# Patient Record
Sex: Female | Born: 1990 | Race: White | Hispanic: No | Marital: Married | Smoking: Never smoker
Health system: Southern US, Community
[De-identification: ages and names within clinical notes are randomized; demographics above are authoritative.]

## PROBLEM LIST (undated history)

## (undated) ENCOUNTER — Inpatient Hospital Stay (HOSPITAL_COMMUNITY): Payer: Self-pay

## (undated) DIAGNOSIS — K8020 Calculus of gallbladder without cholecystitis without obstruction: Secondary | ICD-10-CM

## (undated) DIAGNOSIS — G43909 Migraine, unspecified, not intractable, without status migrainosus: Secondary | ICD-10-CM

## (undated) HISTORY — PX: TONSILLECTOMY: SUR1361

## (undated) HISTORY — PX: TYMPANOSTOMY TUBE PLACEMENT: SHX32

---
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
## 2011-03-18 LAB — DIFFERENTIAL
Basophils Absolute: 0 10*3/uL (ref 0.0–0.1)
Eosinophils Absolute: 0.1 10*3/uL (ref 0.0–1.2)
Eosinophils Relative: 2 % (ref 0–5)
Lymphs Abs: 1.9 10*3/uL (ref 1.1–4.8)

## 2011-03-18 LAB — COMPREHENSIVE METABOLIC PANEL
ALT: 19 U/L (ref 0–35)
AST: 20 U/L (ref 0–37)
CO2: 23 mEq/L (ref 19–32)
Chloride: 105 mEq/L (ref 96–112)
Sodium: 135 mEq/L (ref 135–145)
Total Bilirubin: 0.6 mg/dL (ref 0.3–1.2)

## 2011-03-18 LAB — CBC
RBC: 4.26 MIL/uL (ref 3.80–5.70)
WBC: 5.6 10*3/uL (ref 4.5–13.5)

## 2011-03-18 LAB — URINE CULTURE

## 2011-03-18 LAB — URINALYSIS, ROUTINE W REFLEX MICROSCOPIC
Hgb urine dipstick: NEGATIVE
Nitrite: NEGATIVE
Specific Gravity, Urine: 1.009 (ref 1.005–1.030)
Urobilinogen, UA: 0.2 mg/dL (ref 0.0–1.0)

## 2011-03-18 LAB — LIPASE, BLOOD: Lipase: 14 U/L (ref 11–59)

## 2011-03-18 LAB — URINE MICROSCOPIC-ADD ON

## 2011-04-08 ENCOUNTER — Other Ambulatory Visit (HOSPITAL_COMMUNITY): Payer: Self-pay | Admitting: Family Medicine

## 2011-04-08 DIAGNOSIS — N946 Dysmenorrhea, unspecified: Secondary | ICD-10-CM

## 2011-04-08 DIAGNOSIS — R102 Pelvic and perineal pain: Secondary | ICD-10-CM

## 2011-04-10 ENCOUNTER — Other Ambulatory Visit (HOSPITAL_COMMUNITY): Payer: Self-pay | Admitting: Family Medicine

## 2011-04-10 ENCOUNTER — Ambulatory Visit (HOSPITAL_COMMUNITY)
Admission: RE | Admit: 2011-04-10 | Discharge: 2011-04-10 | Disposition: A | Discharge status: Home / Self Care | Payer: BC Managed Care – PPO | Source: Ambulatory Visit | Attending: Family Medicine | Admitting: Family Medicine

## 2011-04-10 DIAGNOSIS — N949 Unspecified condition associated with female genital organs and menstrual cycle: Secondary | ICD-10-CM | POA: Insufficient documentation

## 2011-04-10 DIAGNOSIS — R102 Pelvic and perineal pain: Secondary | ICD-10-CM

## 2011-04-10 DIAGNOSIS — N946 Dysmenorrhea, unspecified: Secondary | ICD-10-CM

## 2013-10-10 ENCOUNTER — Other Ambulatory Visit (HOSPITAL_COMMUNITY): Payer: Self-pay | Admitting: Adult Health Nurse Practitioner

## 2013-10-10 ENCOUNTER — Ambulatory Visit (HOSPITAL_COMMUNITY)
Admission: RE | Admit: 2013-10-10 | Discharge: 2013-10-10 | Disposition: A | Discharge status: Home / Self Care | Payer: PRIVATE HEALTH INSURANCE | Source: Ambulatory Visit | Attending: Adult Health Nurse Practitioner | Admitting: Adult Health Nurse Practitioner

## 2013-10-10 DIAGNOSIS — G039 Meningitis, unspecified: Secondary | ICD-10-CM

## 2013-10-10 DIAGNOSIS — R112 Nausea with vomiting, unspecified: Secondary | ICD-10-CM | POA: Insufficient documentation

## 2013-10-10 DIAGNOSIS — R509 Fever, unspecified: Secondary | ICD-10-CM | POA: Insufficient documentation

## 2013-10-10 DIAGNOSIS — R51 Headache: Secondary | ICD-10-CM | POA: Insufficient documentation

## 2013-10-17 ENCOUNTER — Emergency Department (HOSPITAL_COMMUNITY)
Admission: EM | Admit: 2013-10-17 | Discharge: 2013-10-17 | Disposition: A | Discharge status: Home / Self Care | Payer: PRIVATE HEALTH INSURANCE | Attending: Emergency Medicine | Admitting: Emergency Medicine

## 2013-10-17 ENCOUNTER — Emergency Department (HOSPITAL_COMMUNITY): Payer: PRIVATE HEALTH INSURANCE

## 2013-10-17 ENCOUNTER — Encounter (HOSPITAL_COMMUNITY): Payer: Self-pay | Admitting: Emergency Medicine

## 2013-10-17 DIAGNOSIS — R55 Syncope and collapse: Secondary | ICD-10-CM | POA: Insufficient documentation

## 2013-10-17 DIAGNOSIS — R51 Headache: Secondary | ICD-10-CM | POA: Insufficient documentation

## 2013-10-17 DIAGNOSIS — R519 Headache, unspecified: Secondary | ICD-10-CM

## 2013-10-17 DIAGNOSIS — R42 Dizziness and giddiness: Secondary | ICD-10-CM | POA: Insufficient documentation

## 2013-10-17 DIAGNOSIS — R209 Unspecified disturbances of skin sensation: Secondary | ICD-10-CM | POA: Insufficient documentation

## 2013-10-17 DIAGNOSIS — Z79899 Other long term (current) drug therapy: Secondary | ICD-10-CM | POA: Insufficient documentation

## 2013-10-17 DIAGNOSIS — Z3202 Encounter for pregnancy test, result negative: Secondary | ICD-10-CM | POA: Insufficient documentation

## 2013-10-17 DIAGNOSIS — R110 Nausea: Secondary | ICD-10-CM | POA: Insufficient documentation

## 2013-10-17 LAB — CSF CELL COUNT WITH DIFFERENTIAL
RBC Count, CSF: 5 /mm3 — ABNORMAL HIGH
WBC, CSF: 0 /mm3 (ref 0–5)
WBC, CSF: 0 /mm3 (ref 0–5)

## 2013-10-17 LAB — PREGNANCY, URINE: Preg Test, Ur: NEGATIVE

## 2013-10-17 LAB — PROTEIN AND GLUCOSE, CSF
Glucose, CSF: 59 mg/dL (ref 43–76)
Total  Protein, CSF: 30 mg/dL (ref 15–45)

## 2013-10-17 LAB — URINE MICROSCOPIC-ADD ON

## 2013-10-17 LAB — URINALYSIS, ROUTINE W REFLEX MICROSCOPIC
Bilirubin Urine: NEGATIVE
Ketones, ur: NEGATIVE mg/dL
Nitrite: NEGATIVE
Protein, ur: NEGATIVE mg/dL
Urobilinogen, UA: 0.2 mg/dL (ref 0.0–1.0)

## 2013-10-17 LAB — CBC WITH DIFFERENTIAL/PLATELET
Basophils Absolute: 0 10*3/uL (ref 0.0–0.1)
Basophils Relative: 0 % (ref 0–1)
Eosinophils Absolute: 0 10*3/uL (ref 0.0–0.7)
Eosinophils Relative: 0 % (ref 0–5)
HCT: 40.9 % (ref 36.0–46.0)
MCH: 30.2 pg (ref 26.0–34.0)
MCHC: 34 g/dL (ref 30.0–36.0)
Monocytes Absolute: 0.5 10*3/uL (ref 0.1–1.0)
Neutro Abs: 7.7 10*3/uL (ref 1.7–7.7)
RDW: 12.7 % (ref 11.5–15.5)

## 2013-10-17 LAB — BASIC METABOLIC PANEL
Calcium: 9.6 mg/dL (ref 8.4–10.5)
Chloride: 100 mEq/L (ref 96–112)
Creatinine, Ser: 0.66 mg/dL (ref 0.50–1.10)
GFR calc Af Amer: >90 mL/min (ref >90)
GFR calc non Af Amer: >90 mL/min (ref >90)

## 2013-10-17 MED ORDER — METOCLOPRAMIDE HCL 5 MG/ML IJ SOLN
10.0000 mg | Freq: Once | INTRAMUSCULAR | Status: AC
  Administered 2013-10-17: 10 mg via INTRAVENOUS
  Filled 2013-10-17: qty 2

## 2013-10-17 MED ORDER — SODIUM CHLORIDE 0.9 % IV BOLUS (SEPSIS)
1000.0000 mL | Freq: Once | INTRAVENOUS | Status: AC
  Administered 2013-10-17: 1000 mL via INTRAVENOUS

## 2013-10-17 MED ORDER — KETOROLAC TROMETHAMINE 30 MG/ML IJ SOLN
30.0000 mg | Freq: Once | INTRAMUSCULAR | Status: AC
  Administered 2013-10-17: 30 mg via INTRAVENOUS
  Filled 2013-10-17: qty 1

## 2013-10-17 MED ORDER — LIDOCAINE HCL (PF) 1 % IJ SOLN
INTRAMUSCULAR | Status: AC
  Filled 2013-10-17: qty 5

## 2013-10-17 MED ORDER — DIPHENHYDRAMINE HCL 50 MG/ML IJ SOLN
25.0000 mg | Freq: Once | INTRAMUSCULAR | Status: AC
  Administered 2013-10-17: 25 mg via INTRAVENOUS
  Filled 2013-10-17: qty 1

## 2013-10-17 MED ORDER — ONDANSETRON HCL 4 MG/2ML IJ SOLN
4.0000 mg | Freq: Once | INTRAMUSCULAR | Status: AC
  Administered 2013-10-17: 4 mg via INTRAVENOUS
  Filled 2013-10-17 (×2): qty 2

## 2013-10-17 NOTE — ED Notes (Signed)
Transported to radiology for LP.

## 2013-10-17 NOTE — ED Notes (Signed)
Pt states she had a headache when she went to school this morning. States she passed out twice

## 2013-10-17 NOTE — ED Provider Notes (Signed)
Patient signed out to me to followup on lumbar puncture studies. Patient was originally seen for headache and syncope. CT head was unremarkable. Patient was somewhat hypotensive. ER it was felt that she likely had hypotension or vasovagal episode causing her syncope, but with the headache, lumbar puncture was pursued to rule out subarachnoid hemorrhage. Studies do not suggest any evidence of subarachnoid hemorrhage. Similarly, studies were negative for infection.  Patient was treated for migraine headache with Toradol, Reglan and nitroglycerin. Patient has improved significantly. She has been able to urinate you are without any symptoms. She'll be discharged, is accompanied by her parents.  Gilda Crease, MD 10/17/13 [phone]

## 2013-10-17 NOTE — Procedures (Signed)
Preprocedure Dx: Headache, syncope Postprocedure Dx: Headache, syncope Procedure:  Fluoroscopically guided lumbar puncture Radiologist:  Tyron Russell Anesthesia:  3 ml of 1% lidocaine Specimen:  9 ml CSF, clear colorless EBL:   None Opening pressure: 6 cm H2O Complications: None

## 2013-10-17 NOTE — ED Notes (Signed)
Pt has eaten, up to bathroom and ambulated and reports is feeling much better.  edp notified.

## 2013-10-17 NOTE — ED Notes (Signed)
Pt reports left hand tingling and both sides of face tingling.  States this happened right before she "passed out."  Pt also states both hands and both feet were "rigid" prior and after "passing out."  Denies rigidity at this time.

## 2013-10-17 NOTE — ED Notes (Signed)
Pt returned from from Radiology from LP.  Alert and oriented, laying flat.  Vitals wnl.  No complaints.  nad noted.

## 2013-10-17 NOTE — ED Provider Notes (Signed)
CSN: [id number]     Arrival date & time 10/17/13  1249 History   This chart was scribed for Glynn Octave, MD, by Yevette Edwards, ED Scribe. This patient was seen in room [facility] and the patient's care was started at 1:39 PM.  First MD Initiated Contact with Patient 10/17/13 1323     Chief Complaint  Patient presents with  . Loss of Consciousness   The history is provided by the patient. No language interpreter was used.   HPI Comments: Tara Watson is a 22 y.o. female who presents to the Emergency Department complaining of three episodes of syncope which occurred in class today. Prior to the syncopal episodes, she experienced a headache which worsened, lightheadedness, nausea, and generalized parathesia. The headache prior to the syncopal episodes was the worst headache she has experienced in the previous two weeks. Today's syncopal episodes lasted only a few seconds; she denies any associated head impact. She reports that she had not eaten prior to the syncopes. The pt denies any h/o similar syncopal episodes.The pt has had intermittent daily headaches for two weeks; her headaches are located to the frontal region and their onset vacillates between sudden and gradual. She takes tramadol for her headaches. She denies any chest pain, SOB, or neck pain. She denies any possibility of pregnancy.  The pt states she is allergic to erythromycin, codeine, ceftin, and sulfa-drugs. She is a non-smoker.   History reviewed. No pertinent past medical history. History reviewed. No pertinent past surgical history. No family history on file. History  Substance Use Topics  . Smoking status: Never Smoker   . Smokeless tobacco: Not on file  . Alcohol Use: No   No OB history provided.  Review of Systems  Respiratory: Negative for shortness of breath.   Cardiovascular: Negative for chest pain.  Gastrointestinal: Positive for nausea.  Musculoskeletal: Negative for neck pain.  Neurological: Positive  for syncope, light-headedness, numbness and headaches.  All other systems reviewed and are negative.    Allergies  Ceftin; Codeine; Erythromycin; and Sulfa antibiotics  Home Medications   Current Outpatient Rx  Name  Route  Sig  Dispense  Refill  . Norethin-Eth Estrad-Fe Biphas (LO LOESTRIN FE PO)   Oral   Take 1 tablet by mouth daily.         . ondansetron (ZOFRAN) 4 MG tablet   Oral   Take 4 mg by mouth daily as needed for nausea or vomiting.         . traMADol (ULTRAM) 50 MG tablet   Oral   Take 50 mg by mouth 2 (two) times daily as needed (headache).           Triage Vitals: BP 118/72  Pulse 108  Temp(Src) 98.8 F (37.1 C) (Oral)  Resp 17  Ht 5' (1.524 m)  Wt 140 lb (63.504 kg)  BMI 27.34 kg/m2  SpO2 98%  LMP 09/11/2013  Physical Exam  Nursing note and vitals reviewed. Constitutional: She is oriented to person, place, and time. She appears well-developed and well-nourished. No distress.  HENT:  Head: Normocephalic and atraumatic.  Dry mucous membranes.   Eyes: EOM are normal.  No nystagmus.   Neck: Neck supple. No tracheal deviation present.  Cardiovascular: Normal rate, regular rhythm and normal heart sounds.   No murmur heard. Pulmonary/Chest: Effort normal and breath sounds normal. No respiratory distress. She has no wheezes.  Musculoskeletal: Normal range of motion.  Neurological: She is alert and oriented to person, place, and  time.  CN 2-12 intact, no ataxia on finger to nose, no nystagmus, 5/5 strength throughout, no pronator drift, Romberg negative.   Skin: Skin is warm and dry.  Psychiatric: She has a normal mood and affect. Her behavior is normal.    ED Course  Procedures (including critical care time)  DIAGNOSTIC STUDIES: Oxygen Saturation is 98% on room air, normal by my interpretation.    COORDINATION OF CARE:  1:46 PM- Discussed treatment plan with patient, and the patient agreed to the plan.   Labs Review Labs Reviewed   URINALYSIS, ROUTINE W REFLEX MICROSCOPIC - Abnormal; Notable for the following:    Hgb urine dipstick TRACE (*)    All other components within normal limits  CSF CULTURE  CBC WITH DIFFERENTIAL  BASIC METABOLIC PANEL  PREGNANCY, URINE  URINE MICROSCOPIC-ADD ON  CSF CELL COUNT WITH DIFFERENTIAL  CSF CELL COUNT WITH DIFFERENTIAL  PROTEIN AND GLUCOSE, CSF   Imaging Review Ct Head Wo Contrast  10/17/2013   CLINICAL DATA:  Two week history of headache.  History of fainting.  EXAM: CT HEAD WITHOUT CONTRAST  TECHNIQUE: Contiguous axial images were obtained from the base of the skull through the vertex without intravenous contrast.  COMPARISON:  10/10/2013.  FINDINGS: There is no evidence of brain mass, brain hemorrhage, or acute infarction.  The ventricular system is normal size and shape. There is no evidence of shift of midline structures, parenchymal lesion, or subdural or epidural hematoma.  The calvarium is intact. Mastoids are well aerated. No sinusitis is evident.  IMPRESSION: There is no evidence of brain mass, brain hemorrhage, or acute infarction.  No acute or active process is seen. No skull lesion is evident. No sinusitis is evident.   Electronically Signed   By: Onalee Hua  Call M.D.   On: 10/17/2013 15:13   Dg Lumbar Puncture Fluoro Guide  10/17/2013   CLINICAL DATA:  Syncope, headache, recent fever  EXAM: DIAGNOSTIC LUMBAR PUNCTURE UNDER FLUOROSCOPIC GUIDANCE  FLUOROSCOPY TIME:  0 min 12 seconds  PROCEDURE: Informed consent was obtained from the patient prior to the procedure, including potential complications of headache, allergy, and pain. With the patient prone, the lower back was prepped with Betadine. 1% Lidocaine was used for local anesthesia. Lumbar puncture was performed at the L4-L5 level using a gauge needle with return of clear colorless CSF with an opening pressure of 6 cm water (prone). 9 ml of CSF were obtained for laboratory studies. The patient tolerated the procedure well and  there were no apparent complications.  IMPRESSION: Fluoroscopic guided lumbar puncture as above.   Electronically Signed   By: Ulyses Southward M.D.   On: 10/17/2013 16:42    EKG Interpretation   None       MDM  No diagnosis found. History of headaches for the past 2 weeks. Lightheadedness preceding syncopal episode this morning. Patient states headache suddenly get worse and she began to feel nauseated lightheaded and passed out. She said it happened 3 times. No history of tongue biting, seizure activity or incontinence. No chest pain or SOB   normal neurological exam now. CT head negative. EKG normal sinus rhythm. Pregnancy test negative. Orthostatics are positive in heart rate. Etiology of syncope likely vasovagal the patient was sitting at the time. However the sudden worsening of her headache is worrisome for subarachnoid hemorrhage.   Discussed LP with patient who agrees.  Results pending at time of sign out to Dr. Blinda Leatherwood.   Date 10/17/2013  Rate: 98  Rhythm:  normal sinus rhythm  QRS Axis: normal  Intervals: normal  ST/T Wave abnormalities: nonspecific ST/T changes  Conduction Disutrbances:none  Narrative Interpretation:   Old EKG Reviewed: none available  BP 89/55  Pulse 83  Temp(Src) 98.8 F (37.1 C) (Oral)  Resp 18  Ht 5' (1.524 m)  Wt 140 lb (63.504 kg)  BMI 27.34 kg/m2  SpO2 97%  LMP 09/11/2013   I personally performed the services described in this documentation, which was scribed in my presence. The recorded information has been reviewed and is accurate.    Glynn Octave, MD 10/17/13 [phone]

## 2013-10-21 LAB — CSF CULTURE

## 2013-10-21 LAB — CSF CULTURE W GRAM STAIN

## 2013-10-24 ENCOUNTER — Other Ambulatory Visit (HOSPITAL_COMMUNITY): Payer: Self-pay | Admitting: Adult Health Nurse Practitioner

## 2013-10-24 DIAGNOSIS — R55 Syncope and collapse: Secondary | ICD-10-CM

## 2013-10-26 ENCOUNTER — Other Ambulatory Visit (HOSPITAL_COMMUNITY): Payer: Self-pay | Admitting: Adult Health Nurse Practitioner

## 2013-10-26 DIAGNOSIS — R0989 Other specified symptoms and signs involving the circulatory and respiratory systems: Secondary | ICD-10-CM

## 2013-10-27 ENCOUNTER — Other Ambulatory Visit (HOSPITAL_COMMUNITY): Payer: PRIVATE HEALTH INSURANCE

## 2013-10-27 ENCOUNTER — Ambulatory Visit (HOSPITAL_COMMUNITY)
Admission: RE | Admit: 2013-10-27 | Discharge: 2013-10-27 | Disposition: A | Discharge status: Home / Self Care | Payer: PRIVATE HEALTH INSURANCE | Source: Ambulatory Visit | Attending: Adult Health Nurse Practitioner | Admitting: Adult Health Nurse Practitioner

## 2013-10-27 ENCOUNTER — Ambulatory Visit (HOSPITAL_COMMUNITY): Payer: PRIVATE HEALTH INSURANCE

## 2013-10-27 DIAGNOSIS — R51 Headache: Secondary | ICD-10-CM | POA: Insufficient documentation

## 2013-10-27 DIAGNOSIS — R55 Syncope and collapse: Secondary | ICD-10-CM | POA: Insufficient documentation

## 2013-10-27 DIAGNOSIS — Z9889 Other specified postprocedural states: Secondary | ICD-10-CM | POA: Insufficient documentation

## 2013-10-27 DIAGNOSIS — R112 Nausea with vomiting, unspecified: Secondary | ICD-10-CM | POA: Insufficient documentation

## 2013-10-27 MED ORDER — GADOBENATE DIMEGLUMINE 529 MG/ML IV SOLN
13.0000 mL | Freq: Once | INTRAVENOUS | Status: AC | PRN
  Administered 2013-10-27: 13 mL via INTRAVENOUS

## 2013-10-30 ENCOUNTER — Other Ambulatory Visit (HOSPITAL_COMMUNITY): Payer: PRIVATE HEALTH INSURANCE

## 2013-10-31 ENCOUNTER — Ambulatory Visit (HOSPITAL_COMMUNITY)
Admission: RE | Admit: 2013-10-31 | Discharge: 2013-10-31 | Disposition: A | Discharge status: Home / Self Care | Payer: PRIVATE HEALTH INSURANCE | Source: Ambulatory Visit | Attending: Adult Health Nurse Practitioner | Admitting: Adult Health Nurse Practitioner

## 2013-10-31 DIAGNOSIS — I499 Cardiac arrhythmia, unspecified: Secondary | ICD-10-CM | POA: Insufficient documentation

## 2013-10-31 DIAGNOSIS — R0989 Other specified symptoms and signs involving the circulatory and respiratory systems: Secondary | ICD-10-CM | POA: Insufficient documentation

## 2013-10-31 DIAGNOSIS — I369 Nonrheumatic tricuspid valve disorder, unspecified: Secondary | ICD-10-CM

## 2013-10-31 DIAGNOSIS — R55 Syncope and collapse: Secondary | ICD-10-CM | POA: Insufficient documentation

## 2013-10-31 NOTE — Progress Notes (Signed)
*  PRELIMINARY RESULTS* Echocardiogram 2D Echocardiogram has been performed.  Tara Watson 10/31/2013, 3:32 PM

## 2013-11-13 ENCOUNTER — Ambulatory Visit (INDEPENDENT_AMBULATORY_CARE_PROVIDER_SITE_OTHER): Payer: PRIVATE HEALTH INSURANCE | Admitting: Cardiology

## 2013-11-13 ENCOUNTER — Encounter: Payer: Self-pay | Admitting: Cardiology

## 2013-11-13 VITALS — BP 118/87 | HR 83 | Ht 60.0 in | Wt 153.5 lb

## 2013-11-13 DIAGNOSIS — R519 Headache, unspecified: Secondary | ICD-10-CM | POA: Insufficient documentation

## 2013-11-13 DIAGNOSIS — R51 Headache: Secondary | ICD-10-CM

## 2013-11-13 DIAGNOSIS — R55 Syncope and collapse: Secondary | ICD-10-CM

## 2013-11-13 DIAGNOSIS — R0989 Other specified symptoms and signs involving the circulatory and respiratory systems: Secondary | ICD-10-CM | POA: Insufficient documentation

## 2013-11-13 NOTE — Assessment & Plan Note (Signed)
I suggested that they might want to pursue neurological consultation with recurring headaches on chronic intermittent history over time. Could potentially be a vascular headache that may need further treatment.

## 2013-11-13 NOTE — Patient Instructions (Addendum)
Your physician recommends that you schedule a follow-up appointment in: AS NEEDED  

## 2013-11-13 NOTE — Progress Notes (Signed)
Clinical Summary [name] is a 22 y.o.female referred for cardiology consultation by [name] NP for evaluation of a carotid bruit. Record review finds recent evaluation in November with headaches and probable neurocardiogenic syncope. Patient also underwent a lumbar puncture in the ER. On examination she was noted to have a right carotid bruit and was referred for further workup.  Recent brain MRI/MRA showed normal appearance of the brain, normal variant MRA circle of Willis, moderate tortuosity of the cervical right ICA without stenosis. Followup carotid Dopplers showed no evidence of hemodynamically significant ICA stenoses.  Echocardiogram in November showed normal LV wall thickness and chamber size with LVEF 60-65%, normal diastolic parameters, mild tricuspid regurgitation, normal PASP of 15 mm mercury, no pericardial effusion. ECG in November shows sinus rhythm with nonspecific ST changes.  She is here with her father and mother today. We discussed the results of her testing in detail. It is most likely that the tortuosity of the cervical right ICA explains the soft bruit on examination, although otherwise has no clinical significance, shows no evidence of obstruction, and is not an explanation for any of her recent symptomatology.   From a symptom perspective, she reports a long-standing history of recurrent headaches, reportedly saw a neurologist when she was a child. The events of syncope that occurred in November sound vasovagal in description per review today. She works as a Lawyer, also going to nursing school. Mother states that she has been under stress.   Allergies  Allergen Reactions  . Ceftin [Cefuroxime] Hives  . Codeine Hives  . Erythromycin Hives  . Sulfa Antibiotics Other (See Comments)    Gland swelling     Current Outpatient Prescriptions  Medication Sig Dispense Refill  . levonorgestrel-ethinyl estradiol (SEASONALE,INTROVALE,JOLESSA) 0.15-0.03 MG tablet Take 1  tablet by mouth daily.      . ondansetron (ZOFRAN) 4 MG tablet Take 4 mg by mouth daily as needed for nausea or vomiting.      . traMADol (ULTRAM) 50 MG tablet Take 50 mg by mouth 2 (two) times daily as needed (headache).       No current facility-administered medications for this visit.    Past Medical History  Diagnosis Date  . Generalized headaches     History reviewed. No pertinent past surgical history.  History reviewed. No pertinent family history.  Social History Tara Watson reports that she has never smoked. She does not have any smokeless tobacco history on file. Tara Watson reports that she does not drink alcohol.  Review of Systems No palpitations, no sudden syncope without warning. No exertional chest pain. Uses contacts for vision. Otherwise negative.  Physical Examination Filed Vitals:   11/13/13 1456  BP: 118/87  Pulse: 83   Filed Weights   11/13/13 1456  Weight: 153 lb 8 oz (69.627 kg)   Normally nourished appearing young woman, appears comfortable. HEENT: Conjunctiva and lids normal, oropharynx clear. Neck: Supple, no elevated JVP, faint high right carotid bruit, no thyromegaly. Lungs: Clear to auscultation, nonlabored breathing at rest. Cardiac: Regular rate and rhythm, no S3 or significant systolic murmur, no pericardial rub. Extremities: No pitting edema, distal pulses 2+. Skin: Warm and dry. Musculoskeletal: No kyphosis. Neuropsychiatric: Alert and oriented x3, affect grossly appropriate.   Problem List and Plan   Carotid bruit Likely secondary to moderately tortuous cervical right ICA with no evidence of hemodynamic significance by Dopplers. This is unlikely to be of any clinical significance at this point, and would not be an explanation  for any of her recent symptoms.  Syncope Description consistent with neurocardiogenic syncope. We discussed triggers, maintaining adequate hydration. Baseline ECG is normal.  Frequent headaches I suggested that  they might want to pursue neurological consultation with recurring headaches on chronic intermittent history over time. Could potentially be a vascular headache that may need further treatment.    Jonelle Sidle, M.D., F.A.C.C.

## 2013-11-13 NOTE — Assessment & Plan Note (Signed)
Likely secondary to moderately tortuous cervical right ICA with no evidence of hemodynamic significance by Dopplers. This is unlikely to be of any clinical significance at this point, and would not be an explanation for any of her recent symptoms.

## 2013-11-13 NOTE — Assessment & Plan Note (Signed)
Description consistent with neurocardiogenic syncope. We discussed triggers, maintaining adequate hydration. Baseline ECG is normal.

## 2014-10-25 LAB — OB RESULTS CONSOLE ABO/RH: RH Type: POSITIVE

## 2014-10-25 LAB — OB RESULTS CONSOLE RPR: RPR: NONREACTIVE

## 2014-10-25 LAB — OB RESULTS CONSOLE ANTIBODY SCREEN: Antibody Screen: NEGATIVE

## 2014-10-25 LAB — OB RESULTS CONSOLE RUBELLA ANTIBODY, IGM: Rubella: IMMUNE

## 2014-10-25 LAB — OB RESULTS CONSOLE HIV ANTIBODY (ROUTINE TESTING): HIV: NONREACTIVE

## 2014-10-25 LAB — OB RESULTS CONSOLE HEPATITIS B SURFACE ANTIGEN: HEP B S AG: NEGATIVE

## 2014-12-14 ENCOUNTER — Inpatient Hospital Stay (HOSPITAL_COMMUNITY)
Admission: AD | Admit: 2014-12-14 | Discharge: 2014-12-15 | Disposition: A | Discharge status: Home / Self Care | Payer: Medicaid Other | Source: Ambulatory Visit | Attending: Obstetrics and Gynecology | Admitting: Obstetrics and Gynecology

## 2014-12-14 ENCOUNTER — Encounter (HOSPITAL_COMMUNITY): Payer: Self-pay

## 2014-12-14 DIAGNOSIS — R1013 Epigastric pain: Secondary | ICD-10-CM | POA: Diagnosis not present

## 2014-12-14 DIAGNOSIS — O209 Hemorrhage in early pregnancy, unspecified: Secondary | ICD-10-CM | POA: Diagnosis not present

## 2014-12-14 DIAGNOSIS — R1011 Right upper quadrant pain: Secondary | ICD-10-CM | POA: Diagnosis not present

## 2014-12-14 DIAGNOSIS — O4692 Antepartum hemorrhage, unspecified, second trimester: Secondary | ICD-10-CM | POA: Diagnosis not present

## 2014-12-14 DIAGNOSIS — Z3A16 16 weeks gestation of pregnancy: Secondary | ICD-10-CM | POA: Diagnosis not present

## 2014-12-14 DIAGNOSIS — O4690 Antepartum hemorrhage, unspecified, unspecified trimester: Secondary | ICD-10-CM | POA: Insufficient documentation

## 2014-12-14 DIAGNOSIS — O9989 Other specified diseases and conditions complicating pregnancy, childbirth and the puerperium: Secondary | ICD-10-CM | POA: Diagnosis not present

## 2014-12-14 NOTE — MAU Note (Signed)
Pt reports she had a sharp pain in her midupper Right quadrant of her abd. A few hours ago felt like she was going to vomit and did. She then went to the BR and had a gush of blood in the toilet. No bleeding since but still feeling the abd pain.

## 2014-12-15 ENCOUNTER — Encounter (HOSPITAL_COMMUNITY): Payer: Self-pay | Admitting: *Deleted

## 2014-12-15 ENCOUNTER — Inpatient Hospital Stay (HOSPITAL_COMMUNITY): Payer: Medicaid Other

## 2014-12-15 DIAGNOSIS — O4692 Antepartum hemorrhage, unspecified, second trimester: Secondary | ICD-10-CM | POA: Diagnosis not present

## 2014-12-15 DIAGNOSIS — Z3A16 16 weeks gestation of pregnancy: Secondary | ICD-10-CM | POA: Insufficient documentation

## 2014-12-15 DIAGNOSIS — O9989 Other specified diseases and conditions complicating pregnancy, childbirth and the puerperium: Secondary | ICD-10-CM

## 2014-12-15 DIAGNOSIS — R1011 Right upper quadrant pain: Secondary | ICD-10-CM

## 2014-12-15 DIAGNOSIS — O4690 Antepartum hemorrhage, unspecified, unspecified trimester: Secondary | ICD-10-CM | POA: Insufficient documentation

## 2014-12-15 LAB — COMPREHENSIVE METABOLIC PANEL
ALBUMIN: 3.4 g/dL — AB (ref 3.5–5.2)
ALK PHOS: 91 U/L (ref 39–117)
ALT: 16 U/L (ref 0–35)
AST: 16 U/L (ref 0–37)
Anion gap: 5 (ref 5–15)
BUN: 6 mg/dL (ref 6–23)
CHLORIDE: 106 meq/L (ref 96–112)
CO2: 27 mmol/L (ref 19–32)
Calcium: 8.9 mg/dL (ref 8.4–10.5)
Creatinine, Ser: 0.38 mg/dL — ABNORMAL LOW (ref 0.50–1.10)
GFR calc Af Amer: >90 mL/min (ref >90)
Glucose, Bld: 89 mg/dL (ref 70–99)
Potassium: 3.8 mmol/L (ref 3.5–5.1)
SODIUM: 138 mmol/L (ref 135–145)
TOTAL PROTEIN: 6.8 g/dL (ref 6.0–8.3)
Total Bilirubin: 0.3 mg/dL (ref 0.3–1.2)

## 2014-12-15 LAB — URINALYSIS, ROUTINE W REFLEX MICROSCOPIC
BILIRUBIN URINE: NEGATIVE
GLUCOSE, UA: NEGATIVE mg/dL
Hgb urine dipstick: NEGATIVE
Ketones, ur: NEGATIVE mg/dL
Leukocytes, UA: NEGATIVE
Nitrite: NEGATIVE
Protein, ur: NEGATIVE mg/dL
Specific Gravity, Urine: 1.02 (ref 1.005–1.030)
Urobilinogen, UA: 0.2 mg/dL (ref 0.0–1.0)
pH: 6.5 (ref 5.0–8.0)

## 2014-12-15 LAB — CBC
HCT: 35.9 % — ABNORMAL LOW (ref 36.0–46.0)
Hemoglobin: 12.4 g/dL (ref 12.0–15.0)
MCH: 30.8 pg (ref 26.0–34.0)
MCHC: 34.5 g/dL (ref 30.0–36.0)
MCV: 89.1 fL (ref 78.0–100.0)
Platelets: 198 K/uL (ref 150–400)
RBC: 4.03 MIL/uL (ref 3.87–5.11)
RDW: 12.7 % (ref 11.5–15.5)
WBC: 8.3 K/uL (ref 4.0–10.5)

## 2014-12-15 LAB — AMYLASE: AMYLASE: 58 U/L (ref 0–105)

## 2014-12-15 LAB — ABO/RH: ABO/RH(D): O POS

## 2014-12-15 LAB — WET PREP, GENITAL
Clue Cells Wet Prep HPF POC: NONE SEEN
Trich, Wet Prep: NONE SEEN
Yeast Wet Prep HPF POC: NONE SEEN

## 2014-12-15 LAB — LIPASE, BLOOD: Lipase: 24 U/L (ref 11–59)

## 2014-12-15 NOTE — Discharge Instructions (Signed)
Vaginal Bleeding During Pregnancy, Second Trimester °A small amount of bleeding (spotting) from the vagina is relatively common in pregnancy. It usually stops on its own. Various things can cause bleeding or spotting in pregnancy. Some bleeding may be related to the pregnancy, and some may not. Sometimes the bleeding is normal and is not a problem. However, bleeding can also be a sign of something serious. Be sure to tell your health care provider about any vaginal bleeding right away. °Some possible causes of vaginal bleeding during the second trimester include: °· Infection, inflammation, or growths on the cervix.   °· The placenta may be partially or completely covering the opening of the cervix inside the uterus (placenta previa). °· The placenta may have separated from the uterus (abruption of the placenta).   °· You may be having early (preterm) labor.   °· The cervix may not be strong enough to keep a baby inside the uterus (cervical insufficiency).   °· Tiny cysts may have developed in the uterus instead of pregnancy tissue (molar pregnancy).  °HOME CARE INSTRUCTIONS  °Watch your condition for any changes. The following actions may help to lessen any discomfort you are feeling: °· Follow your health care provider's instructions for limiting your activity. If your health care provider orders bed rest, you may need to stay in bed and only get up to use the bathroom. However, your health care provider may allow you to continue light activity. °· If needed, make plans for someone to help with your regular activities and responsibilities while you are on bed rest. °· Keep track of the number of pads you use each day, how often you change pads, and how soaked (saturated) they are. Write this down. °· Do not use tampons. Do not douche. °· Do not have sexual intercourse or orgasms until approved by your health care provider. °· If you pass any tissue from your vagina, save the tissue so you can show it to your  health care provider. °· Only take over-the-counter or prescription medicines as directed by your health care provider. °· Do not take aspirin because it can make you bleed. °· Do not exercise or perform any strenuous activities or heavy lifting without your health care provider's permission. °· Keep all follow-up appointments as directed by your health care provider. °SEEK MEDICAL CARE IF: °· You have any vaginal bleeding during any part of your pregnancy. °· You have cramps or labor pains. °· You have a fever, not controlled by medicine. °SEEK IMMEDIATE MEDICAL CARE IF:  °· You have severe cramps in your back or belly (abdomen). °· You have contractions. °· You have chills. °· You pass large clots or tissue from your vagina. °· Your bleeding increases. °· You feel light-headed or weak, or you have fainting episodes. °· You are leaking fluid or have a gush of fluid from your vagina. °MAKE SURE YOU: °· Understand these instructions. °· Will watch your condition. °· Will get help right away if you are not doing well or get worse. °Document Released: 09/09/2005 Document Revised: 12/05/2013 Document Reviewed: 08/07/2013 °[employer]® Patient Information ©2015 [employer]. This information is not intended to replace advice given to you by your health care provider. Make sure you discuss any questions you have with your health care provider. ° °

## 2014-12-15 NOTE — MAU Provider Note (Signed)
Chief Complaint: Vaginal Bleeding   First Provider Initiated Contact with Patient 12/15/14 0013     SUBJECTIVE HPI: Tara Watson is a 24 y.o. G1P0 at [redacted]w[redacted]d by LMP who presents with one episode of sharp right upper quadrant and epigastric pain tonight followed by a gush of bright red vaginal bleeding heavier than the beginning of the menstrual period tonight. No bleeding since then. No other bleeding this pregnancy. Pain improved after vomiting. Has since resolved. No history of similar pain. no history of gallbladder problems. Did not eat anything for dinner tonight.  [facility].  Denies fever, chills, low abdominal pain, vaginal discharge, passage of clots or tissue.  O pos   Past Medical History  Diagnosis Date  . Generalized headaches    OB History  Gravida Para Term Preterm AB SAB TAB Ectopic Multiple Living  1             # Outcome Date GA Lbr Len/2nd Weight Sex Delivery Anes PTL Lv  1 Current              Past Surgical History  Procedure Laterality Date  . Tonsillectomy     History   Social History  . Marital Status: Single    Spouse Name: N/A    Number of Children: N/A  . Years of Education: N/A   Occupational History  . Not on file.   Social History Main Topics  . Smoking status: Never Smoker   . Smokeless tobacco: Not on file  . Alcohol Use: No  . Drug Use: No  . Sexual Activity: Not on file   Other Topics Concern  . Not on file   Social History Narrative   No current facility-administered medications on file prior to encounter.   Current Outpatient Prescriptions on File Prior to Encounter  Medication Sig Dispense Refill  . levonorgestrel-ethinyl estradiol (SEASONALE,INTROVALE,JOLESSA) 0.15-0.03 MG tablet Take 1 tablet by mouth daily.    . ondansetron (ZOFRAN) 4 MG tablet Take 4 mg by mouth daily as needed for nausea or vomiting.    . traMADol (ULTRAM) 50 MG tablet Take 50 mg by mouth 2 (two) times daily as needed  (headache).     Allergies  Allergen Reactions  . Ceftin [Cefuroxime] Hives  . Codeine Hives  . Erythromycin Hives  . Sulfa Antibiotics Other (See Comments)    Gland swelling     ROS: Pertinent  positive items in HPI. Denies fever, chills, low abdominal pain, vaginal discharge, passage of clots or tissue.  OBJECTIVE Blood pressure 108/70, pulse 87, temperature 98.9 F (37.2 C), temperature source Oral, resp. rate 18, height  (1.549 m), weight 65.046 kg (143 lb 6.4 oz). GENERAL: Well-developed, well-nourished female in no acute distress.  HEENT: Normocephalic HEART: normal rate RESP: normal effort ABDOMEN: Soft, non-tender. Positive bowel sounds 4.  EXTREMITIES: Nontender, no edema NEURO: Alert and oriented SPECULUM EXAM: NEFG, small amount of thin, white, odorless, physiologic discharge, no blood noted, cervix clean.  BIMANUAL: cervix closed; uterus 16-week size, no adnexal tenderness or masses Fetal heart rate 150 by Doppler.  LAB RESULTS Results for orders placed or performed during the hospital encounter of 12/14/14 (from the past 24 hour(s))  Urinalysis, Routine w reflex microscopic     Status: None   Collection Time: 12/14/14 11:50 PM  Result Value Ref Range   Color, Urine YELLOW YELLOW   APPearance CLEAR CLEAR   Specific Gravity, Urine 1.020 1.005 - 1.030   pH  6.5 5.0 - 8.0   Glucose, UA NEGATIVE NEGATIVE mg/dL   Hgb urine dipstick NEGATIVE NEGATIVE   Bilirubin Urine NEGATIVE NEGATIVE   Ketones, ur NEGATIVE NEGATIVE mg/dL   Protein, ur NEGATIVE NEGATIVE mg/dL   Urobilinogen, UA 0.2 0.0 - 1.0 mg/dL   Nitrite NEGATIVE NEGATIVE   Leukocytes, UA NEGATIVE NEGATIVE  ABO/Rh     Status: None (Preliminary result)   Collection Time: 12/15/14 12:20 AM  Result Value Ref Range   ABO/RH(D) O POS   CBC     Status: Abnormal   Collection Time: 12/15/14 12:20 AM  Result Value Ref Range   WBC 8.3 4.0 - 10.5 K/uL   RBC 4.03 3.87 - 5.11 MIL/uL   Hemoglobin 12.4 12.0 - 15.0  g/dL   HCT 16.1 (L) 09.6 - 04.5 %   MCV 89.1 78.0 - 100.0 fL   MCH 30.8 26.0 - 34.0 pg   MCHC 34.5 30.0 - 36.0 g/dL   RDW 40.9 81.1 - 91.4 %   Platelets 198 150 - 400 K/uL  Comprehensive metabolic panel     Status: Abnormal   Collection Time: 12/15/14 12:20 AM  Result Value Ref Range   Sodium 138 135 - 145 mmol/L   Potassium 3.8 3.5 - 5.1 mmol/L   Chloride 106 96 - 112 mEq/L   CO2 27 19 - 32 mmol/L   Glucose, Bld 89 70 - 99 mg/dL   BUN 6 6 - 23 mg/dL   Creatinine, Ser 7.82 (L) 0.50 - 1.10 mg/dL   Calcium 8.9 8.4 - 95.6 mg/dL   Total Protein 6.8 6.0 - 8.3 g/dL   Albumin 3.4 (L) 3.5 - 5.2 g/dL   AST 16 0 - 37 U/L   ALT 16 0 - 35 U/L   Alkaline Phosphatase 91 39 - 117 U/L   Total Bilirubin 0.3 0.3 - 1.2 mg/dL   GFR calc non Af Amer >90 >90 mL/min   GFR calc Af Amer >90 >90 mL/min   Anion gap 5 5 - 15  Amylase     Status: None   Collection Time: 12/15/14 12:20 AM  Result Value Ref Range   Amylase 58 0 - 105 U/L  Lipase, blood     Status: None   Collection Time: 12/15/14 12:20 AM  Result Value Ref Range   Lipase 24 11 - 59 U/L    IMAGING Preliminary: Single intrauterine pregnancy. Fetal heart rate 142. Placenta anterior, above cervical os. Cervical length 2.8 cm.  MAU COURSE OB Limited for placentation and evaluate for abruption, CBC, CMP, amylase, lipase, ABO Rh.    ASSESSMENT 1. Right upper quadrant pain   2. Vaginal bleeding in pregnancy, second trimester    PLAN Discharge home in stable condition per consult w/ Dr. Henderson Cloud. Bleeding precautions. Pelvic rest 1 week.     Follow-up Information    Follow up with TOMBLIN Cristie Hem, MD.   Specialty:  Obstetrics and Gynecology   Why:  As scheduled or sooner as needed if symptoms worsen   Contact information:   [address] [place] [address] [phone]       Follow up with [facility] ADMISS[facility].   Why:  As needed in emergencies   Contact information:    [address] [id number] mc [place] [address] [phone]       Medication List    STOP taking these medications        levonorgestrel-ethinyl estradiol 0.15-0.03 MG  tablet  Commonly known as:  SEASONALE,INTROVALE,JOLESSA      TAKE these medications        ondansetron 4 MG tablet  Commonly known as:  ZOFRAN  Take 4 mg by mouth daily as needed for nausea or vomiting.     traMADol 50 MG tablet  Commonly known as:  ULTRAM  Take 50 mg by mouth 2 (two) times daily as needed (headache).       [place], CNM 12/15/2014  3:10 AM

## 2014-12-19 ENCOUNTER — Other Ambulatory Visit (HOSPITAL_COMMUNITY): Payer: Self-pay | Admitting: Obstetrics and Gynecology

## 2014-12-19 ENCOUNTER — Inpatient Hospital Stay (HOSPITAL_COMMUNITY)
Admission: AD | Admit: 2014-12-19 | Discharge: 2014-12-19 | Disposition: A | Discharge status: Home / Self Care | Payer: Medicaid Other | Source: Ambulatory Visit | Attending: Obstetrics and Gynecology | Admitting: Obstetrics and Gynecology

## 2014-12-19 DIAGNOSIS — R109 Unspecified abdominal pain: Secondary | ICD-10-CM | POA: Insufficient documentation

## 2014-12-19 DIAGNOSIS — Z3A16 16 weeks gestation of pregnancy: Secondary | ICD-10-CM | POA: Insufficient documentation

## 2014-12-19 DIAGNOSIS — O9989 Other specified diseases and conditions complicating pregnancy, childbirth and the puerperium: Secondary | ICD-10-CM | POA: Diagnosis not present

## 2014-12-19 DIAGNOSIS — R1011 Right upper quadrant pain: Secondary | ICD-10-CM

## 2014-12-19 LAB — URINALYSIS, ROUTINE W REFLEX MICROSCOPIC
BILIRUBIN URINE: NEGATIVE
Glucose, UA: NEGATIVE mg/dL
HGB URINE DIPSTICK: NEGATIVE
Ketones, ur: 15 mg/dL — AB
Leukocytes, UA: NEGATIVE
Nitrite: NEGATIVE
PH: 6 (ref 5.0–8.0)
PROTEIN: NEGATIVE mg/dL
Specific Gravity, Urine: >1.03 — ABNORMAL HIGH (ref 1.005–1.030)
UROBILINOGEN UA: 0.2 mg/dL (ref 0.0–1.0)

## 2014-12-19 LAB — GC/CHLAMYDIA PROBE AMP
CT Probe RNA: NEGATIVE
GC Probe RNA: NEGATIVE

## 2014-12-19 NOTE — MAU Note (Signed)
Was here on Friday, over the weekend continued to have pain in upper abd- had her in tears.  Went to dr on Monday, was told to try pepcid. . Did get a little better. Today, pain is back, gets shaky, sweaty, and light headed- vision becomes blurry.  Taking pepcid and nexium- doesn't seem to help

## 2014-12-20 ENCOUNTER — Ambulatory Visit (HOSPITAL_COMMUNITY)
Admission: RE | Admit: 2014-12-20 | Discharge: 2014-12-20 | Disposition: A | Discharge status: Home / Self Care | Payer: Medicaid Other | Source: Ambulatory Visit | Attending: Obstetrics and Gynecology | Admitting: Obstetrics and Gynecology

## 2014-12-20 DIAGNOSIS — R109 Unspecified abdominal pain: Secondary | ICD-10-CM | POA: Diagnosis present

## 2014-12-20 DIAGNOSIS — R1011 Right upper quadrant pain: Secondary | ICD-10-CM

## 2014-12-20 DIAGNOSIS — K8020 Calculus of gallbladder without cholecystitis without obstruction: Secondary | ICD-10-CM | POA: Diagnosis not present

## 2015-02-05 ENCOUNTER — Inpatient Hospital Stay (HOSPITAL_COMMUNITY)
Admission: AD | Admit: 2015-02-05 | Discharge: 2015-02-05 | Disposition: A | Discharge status: Home / Self Care | Payer: Medicaid Other | Source: Ambulatory Visit | Attending: Obstetrics & Gynecology | Admitting: Obstetrics & Gynecology

## 2015-02-05 ENCOUNTER — Encounter (HOSPITAL_COMMUNITY): Payer: Self-pay | Admitting: General Practice

## 2015-02-05 DIAGNOSIS — Z3A23 23 weeks gestation of pregnancy: Secondary | ICD-10-CM

## 2015-02-05 DIAGNOSIS — O210 Mild hyperemesis gravidarum: Secondary | ICD-10-CM | POA: Insufficient documentation

## 2015-02-05 DIAGNOSIS — K8080 Other cholelithiasis without obstruction: Secondary | ICD-10-CM

## 2015-02-05 DIAGNOSIS — K8020 Calculus of gallbladder without cholecystitis without obstruction: Secondary | ICD-10-CM | POA: Diagnosis not present

## 2015-02-05 DIAGNOSIS — O9989 Other specified diseases and conditions complicating pregnancy, childbirth and the puerperium: Secondary | ICD-10-CM | POA: Insufficient documentation

## 2015-02-05 DIAGNOSIS — O219 Vomiting of pregnancy, unspecified: Secondary | ICD-10-CM

## 2015-02-05 DIAGNOSIS — Z3A22 22 weeks gestation of pregnancy: Secondary | ICD-10-CM | POA: Insufficient documentation

## 2015-02-05 DIAGNOSIS — O99612 Diseases of the digestive system complicating pregnancy, second trimester: Secondary | ICD-10-CM

## 2015-02-05 LAB — COMPREHENSIVE METABOLIC PANEL
ALT: 17 U/L (ref 0–35)
AST: 26 U/L (ref 0–37)
Albumin: 3.3 g/dL — ABNORMAL LOW (ref 3.5–5.2)
Alkaline Phosphatase: 126 U/L — ABNORMAL HIGH (ref 39–117)
Anion gap: 5 (ref 5–15)
BUN: 7 mg/dL (ref 6–23)
CHLORIDE: 108 mmol/L (ref 96–112)
CO2: 21 mmol/L (ref 19–32)
CREATININE: 0.48 mg/dL — AB (ref 0.50–1.10)
Calcium: 8.7 mg/dL (ref 8.4–10.5)
GFR calc Af Amer: >90 mL/min (ref >90)
Glucose, Bld: 73 mg/dL (ref 70–99)
POTASSIUM: 4.2 mmol/L (ref 3.5–5.1)
SODIUM: 134 mmol/L — AB (ref 135–145)
Total Bilirubin: 1 mg/dL (ref 0.3–1.2)
Total Protein: 7.2 g/dL (ref 6.0–8.3)

## 2015-02-05 LAB — URINALYSIS, ROUTINE W REFLEX MICROSCOPIC
Bilirubin Urine: NEGATIVE
Glucose, UA: NEGATIVE mg/dL
Hgb urine dipstick: NEGATIVE
Ketones, ur: NEGATIVE mg/dL
LEUKOCYTES UA: NEGATIVE
Nitrite: NEGATIVE
Protein, ur: NEGATIVE mg/dL
SPECIFIC GRAVITY, URINE: 1.025 (ref 1.005–1.030)
UROBILINOGEN UA: 0.2 mg/dL (ref 0.0–1.0)
pH: 7 (ref 5.0–8.0)

## 2015-02-05 LAB — CBC
HEMATOCRIT: 38.2 % (ref 36.0–46.0)
Hemoglobin: 13.1 g/dL (ref 12.0–15.0)
MCH: 31.4 pg (ref 26.0–34.0)
MCHC: 34.3 g/dL (ref 30.0–36.0)
MCV: 91.6 fL (ref 78.0–100.0)
PLATELETS: 238 10*3/uL (ref 150–400)
RBC: 4.17 MIL/uL (ref 3.87–5.11)
RDW: 13.4 % (ref 11.5–15.5)
WBC: 11.8 10*3/uL — ABNORMAL HIGH (ref 4.0–10.5)

## 2015-02-05 LAB — LIPASE, BLOOD: Lipase: 25 U/L (ref 11–59)

## 2015-02-05 LAB — AMYLASE: Amylase: 83 U/L (ref 0–105)

## 2015-02-05 MED ORDER — FAMOTIDINE 20 MG PO TABS: 20.0000 mg | ORAL_TABLET | Freq: Two times a day (BID) | ORAL | Status: DC

## 2015-02-05 MED ORDER — DEXTROSE 5 % IN LACTATED RINGERS IV BOLUS
1000.0000 mL | Freq: Once | INTRAVENOUS | Status: AC
  Administered 2015-02-05: 1000 mL via INTRAVENOUS

## 2015-02-05 MED ORDER — FAMOTIDINE IN NACL 20-0.9 MG/50ML-% IV SOLN
20.0000 mg | Freq: Once | INTRAVENOUS | Status: AC
  Administered 2015-02-05: 20 mg via INTRAVENOUS
  Filled 2015-02-05: qty 50

## 2015-02-05 NOTE — MAU Provider Note (Signed)
History     CSN: [id number]  Arrival date and time: 02/05/15 1250   First Provider Initiated Contact with Patient 02/05/15 1336      Chief Complaint  Patient presents with  . Emesis  . Fever   HPIpt is G1p0 @23weeks  3 days with known small gall stones dx 12/20/2014 Pt has not gotten better.  Pt doesn't get nauseated but just throws up.  Pt ate frosted flakes this morning and threw up. Pt denies spotting or bleeding.    MAU Note 02/05/2015 1:02 PM    Expand All Collapse All   Started throwing up around 0500, unable to keep anything down. Had a fever, 101.2. Has been taking Tylenol.no diarrhea Denies any know exposure.         Took tylenol at 12 noon-1 1/4 hours ago.   Past Medical History  Diagnosis Date  . Generalized headaches   . Medical history non-contributory     Past Surgical History  Procedure Laterality Date  . Tonsillectomy      History reviewed. No pertinent family history.  History  Substance Use Topics  . Smoking status: Never Smoker   . Smokeless tobacco: Not on file  . Alcohol Use: No    Allergies:  Allergies  Allergen Reactions  . Ceftin [Cefuroxime] Hives  . Codeine Hives  . Erythromycin Hives  . Sulfa Antibiotics Other (See Comments)    Gland swelling     Prescriptions prior to admission  Medication Sig Dispense Refill Last Dose  . ondansetron (ZOFRAN) 4 MG tablet Take 4 mg by mouth daily as needed for nausea or vomiting.   Not Taking  . traMADol (ULTRAM) 50 MG tablet Take 50 mg by mouth 2 (two) times daily as needed (headache).   Not Taking    Review of Systems  Constitutional: Positive for fever.  Gastrointestinal: Positive for nausea, vomiting and abdominal pain. Negative for diarrhea.  Genitourinary: Negative for dysuria.   Physical Exam   Blood pressure 107/66, pulse 83, temperature 99 F (37.2 C), temperature source Oral, resp. rate 17, weight 149 lb (67.586 kg), SpO2 100 %.  Physical Exam  Nursing note and vitals  reviewed. Constitutional: She is oriented to person, place, and time. She appears well-developed and well-nourished. No distress.  HENT:  Head: Normocephalic.  Eyes: Pupils are equal, round, and reactive to light.  Neck: Normal range of motion. Neck supple.  Cardiovascular: Normal rate.   Respiratory: Effort normal.  GI: Soft. She exhibits no distension. There is no tenderness.  FHR 135 on monitor with 10x10 accelerations No ctx noted  Musculoskeletal: Normal range of motion.  Neurological: She is alert and oriented to person, place, and time.  Skin: Skin is warm and dry.  Psychiatric: She has a normal mood and affect.    MAU Course  Procedures Results for orders placed or performed during the hospital encounter of 02/05/15 (from the past 24 hour(s))  Urinalysis, Routine w reflex microscopic     Status: None   Collection Time: 02/05/15  1:03 PM  Result Value Ref Range   Color, Urine YELLOW YELLOW   APPearance CLEAR CLEAR   Specific Gravity, Urine 1.025 1.005 - 1.030   pH 7.0 5.0 - 8.0   Glucose, UA NEGATIVE NEGATIVE mg/dL   Hgb urine dipstick NEGATIVE NEGATIVE   Bilirubin Urine NEGATIVE NEGATIVE   Ketones, ur NEGATIVE NEGATIVE mg/dL   Protein, ur NEGATIVE NEGATIVE mg/dL   Urobilinogen, UA 0.2 0.0 - 1.0 mg/dL   Nitrite NEGATIVE NEGATIVE  Leukocytes, UA NEGATIVE NEGATIVE  CBC     Status: Abnormal   Collection Time: 02/05/15  2:08 PM  Result Value Ref Range   WBC 11.8 (H) 4.0 - 10.5 K/uL   RBC 4.17 3.87 - 5.11 MIL/uL   Hemoglobin 13.1 12.0 - 15.0 g/dL   HCT 40.9 81.1 - 91.4 %   MCV 91.6 78.0 - 100.0 fL   MCH 31.4 26.0 - 34.0 pg   MCHC 34.3 30.0 - 36.0 g/dL   RDW 78.2 95.6 - 21.3 %   Platelets 238 150 - 400 K/uL  Comprehensive metabolic panel     Status: Abnormal   Collection Time: 02/05/15  2:08 PM  Result Value Ref Range   Sodium 134 (L) 135 - 145 mmol/L   Potassium 4.2 3.5 - 5.1 mmol/L   Chloride 108 96 - 112 mmol/L   CO2 21 19 - 32 mmol/L   Glucose, Bld 73 70 -  99 mg/dL   BUN 7 6 - 23 mg/dL   Creatinine, Ser 0.86 (L) 0.50 - 1.10 mg/dL   Calcium 8.7 8.4 - 57.8 mg/dL   Total Protein 7.2 6.0 - 8.3 g/dL   Albumin 3.3 (L) 3.5 - 5.2 g/dL   AST 26 0 - 37 U/L   ALT 17 0 - 35 U/L   Alkaline Phosphatase 126 (H) 39 - 117 U/L   Total Bilirubin 1.0 0.3 - 1.2 mg/dL   GFR calc non Af Amer >90 >90 mL/min   GFR calc Af Amer >90 >90 mL/min   Anion gap 5 5 - 15   D5LR with Pepcid  IV Pt tolerating PO fluids and feeling like going home Discussed with Dr. Langston Masker- will send home Assessment and Plan  Nausea and vomiting- pepcid RX Gallbladder stones- reviewed diet Pregnancy [redacted]w[redacted]d F/u with OB appointment- sooner if increase in sx  Tara Watson 02/05/2015, 1:40 PM

## 2015-02-05 NOTE — MAU Note (Signed)
Started throwing up around 0500, unable to keep anything down. Had a fever, 101.2. Has been taking Tylenol. Had diarrhea yesterday (3) none today. Denies any know exposure.

## 2015-04-24 ENCOUNTER — Encounter (HOSPITAL_COMMUNITY): Payer: Self-pay

## 2015-04-24 ENCOUNTER — Inpatient Hospital Stay (HOSPITAL_COMMUNITY): Payer: Medicaid Other

## 2015-04-24 ENCOUNTER — Observation Stay (HOSPITAL_COMMUNITY)
Admission: AD | Admit: 2015-04-24 | Discharge: 2015-04-25 | Disposition: A | Discharge status: Home / Self Care | Payer: Medicaid Other | Source: Ambulatory Visit | Attending: Obstetrics & Gynecology | Admitting: Obstetrics & Gynecology

## 2015-04-24 DIAGNOSIS — O368130 Decreased fetal movements, third trimester, not applicable or unspecified: Secondary | ICD-10-CM | POA: Diagnosis not present

## 2015-04-24 DIAGNOSIS — O368190 Decreased fetal movements, unspecified trimester, not applicable or unspecified: Secondary | ICD-10-CM | POA: Diagnosis present

## 2015-04-24 DIAGNOSIS — O288 Other abnormal findings on antenatal screening of mother: Secondary | ICD-10-CM | POA: Insufficient documentation

## 2015-04-24 DIAGNOSIS — Z3A35 35 weeks gestation of pregnancy: Secondary | ICD-10-CM

## 2015-04-24 DIAGNOSIS — Z3689 Encounter for other specified antenatal screening: Secondary | ICD-10-CM | POA: Insufficient documentation

## 2015-04-24 DIAGNOSIS — IMO0002 Reserved for concepts with insufficient information to code with codable children: Secondary | ICD-10-CM

## 2015-04-24 DIAGNOSIS — O43123 Velamentous insertion of umbilical cord, third trimester: Secondary | ICD-10-CM | POA: Diagnosis not present

## 2015-04-24 DIAGNOSIS — Z3A34 34 weeks gestation of pregnancy: Secondary | ICD-10-CM | POA: Insufficient documentation

## 2015-04-24 HISTORY — DX: Migraine, unspecified, not intractable, without status migrainosus: G43.909

## 2015-04-24 HISTORY — DX: Calculus of gallbladder without cholecystitis without obstruction: K80.20

## 2015-04-24 MED ORDER — PANTOPRAZOLE SODIUM 40 MG PO TBEC
40.0000 mg | DELAYED_RELEASE_TABLET | Freq: Every day | ORAL | Status: DC
  Filled 2015-04-24 (×2): qty 1

## 2015-04-24 MED ORDER — ACETAMINOPHEN 325 MG PO TABS
650.0000 mg | ORAL_TABLET | ORAL | Status: DC | PRN
  Administered 2015-04-25: 650 mg via ORAL
  Filled 2015-04-24: qty 2

## 2015-04-24 MED ORDER — DOCUSATE SODIUM 100 MG PO CAPS
100.0000 mg | ORAL_CAPSULE | Freq: Every day | ORAL | Status: DC
  Filled 2015-04-24 (×2): qty 1

## 2015-04-24 MED ORDER — PRENATAL MULTIVITAMIN CH
1.0000 | ORAL_TABLET | Freq: Every day | ORAL | Status: DC
  Filled 2015-04-24 (×2): qty 1

## 2015-04-24 MED ORDER — CALCIUM CARBONATE ANTACID 500 MG PO CHEW
2.0000 | CHEWABLE_TABLET | ORAL | Status: DC | PRN
  Filled 2015-04-24: qty 2

## 2015-04-24 MED ORDER — ZOLPIDEM TARTRATE 5 MG PO TABS: 5.0000 mg | ORAL_TABLET | Freq: Every evening | ORAL | Status: DC | PRN

## 2015-04-24 NOTE — MAU Note (Signed)
Sent from office  C/o decreased fetal movement.  Same complaint on Monday, BPP 10/10.  Today is 4/10. Sent for further eval and monitoring.

## 2015-04-24 NOTE — MAU Provider Note (Signed)
History     CSN: [id number]  Arrival date and time: 04/24/15 1049   None     Chief Complaint  Patient presents with  . Decreased Fetal Movement   Other This is a new problem. The current episode started today. The problem has been unchanged (States still does not feel baby moving much). Associated symptoms include abdominal pain (every now and then has sharp pain suprapubically, none since being here). Pertinent negatives include no chills, fever, headaches, nausea or vomiting. Nothing aggravates the symptoms.   This is a 24 y.o. female at 10189w4d who presents for Ultrasound and monitoring from the office. She saw Dr Arelia SneddonMcComb there and had a nonreactive NST and complained of decreased fetal movement. Denies pain, leaking or bleeding.   RN note:  Expand All Collapse All   Patient had regular MD appointment with Dr. Arelia SneddonMcComb decreased fetal movement has not felt baby all day, denies vaginal bleeding no LOF, has gallstones scheduled for surgery 1 week after due date.           OB History    Gravida Para Term Preterm AB TAB SAB Ectopic Multiple Living   1               Past Medical History  Diagnosis Date  . Medical history non-contributory   . Migraines   . Gallstones     Past Surgical History  Procedure Laterality Date  . Tonsillectomy      No family history on file.  History  Substance Use Topics  . Smoking status: Never Smoker   . Smokeless tobacco: Not on file  . Alcohol Use: No    Allergies:  Allergies  Allergen Reactions  . Imitrex [Sumatriptan] Anaphylaxis  . Ceftin [Cefuroxime] Hives  . Codeine Hives  . Erythromycin Hives  . Sulfa Antibiotics Other (See Comments)    Gland swelling     Prescriptions prior to admission  Medication Sig Dispense Refill Last Dose  . omeprazole (PRILOSEC) 40 MG capsule Take 40 mg by mouth daily.   04/24/2015 at Unknown time  . acetaminophen (TYLENOL) 325 MG tablet Take 650 mg by mouth every 6 (six) hours as needed for  fever.   prn  . famotidine (PEPCID) 20 MG tablet Take 1 tablet (20 mg total) by mouth 2 (two) times daily. (Patient not taking: Reported on 04/24/2015) 30 tablet 0     Review of Systems  Constitutional: Negative for fever and chills.  Gastrointestinal: Positive for abdominal pain (every now and then has sharp pain suprapubically, none since being here). Negative for nausea, vomiting, diarrhea and constipation.  Genitourinary: Negative for dysuria.  Neurological: Negative for dizziness, focal weakness and headaches.   Physical Exam   Blood pressure 113/76, pulse 105, temperature 99.2 F (37.3 C), temperature source Oral, resp. rate 16, height 5\' 1"  (1.549 m), weight 161 lb (73.029 kg).  Physical Exam  Constitutional: She is oriented to person, place, and time. She appears well-developed and well-nourished. No distress.  HENT:  Head: Normocephalic.  Cardiovascular: Normal rate, regular rhythm and normal heart sounds.  Exam reveals no gallop and no friction rub.   No murmur heard. Respiratory: Effort normal and breath sounds normal. No respiratory distress. She has no wheezes. She has no rales.  GI: Soft. She exhibits no distension. There is no tenderness. There is no rebound and no guarding.  Genitourinary:  Exam not indicated   Musculoskeletal: Normal range of motion.  Neurological: She is alert and oriented to person, place,  and time.  Skin: Skin is warm and dry.  Psychiatric: She has a normal mood and affect.   Nonstress test = Reactive with good accels, Category I No contractions Maneuvers done to try to elicit fetal activity without success  MAU Course  Procedures  MDM NST reactive BPP 8/8 AFI 15.36 Growth 63%ile  Assessment and Plan  A:  SIUP at 4736w4d       No perception of fetal movement, decreased observed movement      Reactive Category I FHR tracing      Reassuring ultrasound findings  P: Consulted Dr Langston MaskerMorris      Admit for observation and continuous EFM         [facility] Hospital[facility]ILLIAMS,MARIE 04/24/2015, 1:00 PM

## 2015-04-24 NOTE — Progress Notes (Signed)
Patient has had one fetal movement today and that was during her hospital u/s.  BPP in office 4/10 (NST, movement, tone).  Rpt in MAU 10/10.  Since patient does not feel movement at all, will admit for continuous monitoring in observation and repeat BPP tomorrow.    Mitchel HonourMegan Kyre Jeffries, DO

## 2015-04-24 NOTE — MAU Note (Signed)
Notified Dr. Langston MaskerMorris patient on efm, good variability, 10x10s, patient BPP in office 4/8, received orders.

## 2015-04-24 NOTE — MAU Note (Signed)
Patient had regular MD appointment with Dr. Arelia SneddonMcComb decreased fetal movement has not felt baby all day, denies vaginal bleeding no LOF, has gallstones scheduled for surgery 1 week after due date.

## 2015-04-24 NOTE — Discharge Instructions (Signed)

## 2015-04-25 ENCOUNTER — Observation Stay (HOSPITAL_COMMUNITY): Payer: Medicaid Other

## 2015-04-25 LAB — COMPREHENSIVE METABOLIC PANEL
ALBUMIN: 2.6 g/dL — AB (ref 3.5–5.0)
ALT: 12 U/L — ABNORMAL LOW (ref 14–54)
AST: 23 U/L (ref 15–41)
Alkaline Phosphatase: 176 U/L — ABNORMAL HIGH (ref 38–126)
Anion gap: 11 (ref 5–15)
BUN: 7 mg/dL (ref 6–20)
CALCIUM: 8.3 mg/dL — AB (ref 8.9–10.3)
CO2: 20 mmol/L — ABNORMAL LOW (ref 22–32)
CREATININE: 0.54 mg/dL (ref 0.44–1.00)
Chloride: 103 mmol/L (ref 101–111)
GFR calc Af Amer: >60 mL/min (ref >60)
Glucose, Bld: 138 mg/dL — ABNORMAL HIGH (ref 65–99)
Potassium: 3.4 mmol/L — ABNORMAL LOW (ref 3.5–5.1)
SODIUM: 134 mmol/L — AB (ref 135–145)
Total Bilirubin: 0.1 mg/dL — ABNORMAL LOW (ref 0.3–1.2)
Total Protein: 6.3 g/dL — ABNORMAL LOW (ref 6.5–8.1)

## 2015-04-25 LAB — CBC
HCT: 34.7 % — ABNORMAL LOW (ref 36.0–46.0)
Hemoglobin: 11.8 g/dL — ABNORMAL LOW (ref 12.0–15.0)
MCH: 30.3 pg (ref 26.0–34.0)
MCHC: 34 g/dL (ref 30.0–36.0)
MCV: 89.2 fL (ref 78.0–100.0)
PLATELETS: 175 10*3/uL (ref 150–400)
RBC: 3.89 MIL/uL (ref 3.87–5.11)
RDW: 13.2 % (ref 11.5–15.5)
WBC: 8.7 10*3/uL (ref 4.0–10.5)

## 2015-04-25 LAB — AMNISURE RUPTURE OF MEMBRANE (ROM) NOT AT ARMC: Amnisure ROM: NEGATIVE

## 2015-04-25 LAB — FIBRINOGEN: Fibrinogen: 543 mg/dL — ABNORMAL HIGH (ref 204–475)

## 2015-04-25 LAB — URIC ACID: Uric Acid, Serum: 5.2 mg/dL (ref 2.3–6.6)

## 2015-04-25 MED ORDER — FAMOTIDINE 20 MG PO TABS
40.0000 mg | ORAL_TABLET | Freq: Every day | ORAL | Status: DC
  Administered 2015-04-25: 40 mg via ORAL
  Filled 2015-04-25: qty 2

## 2015-04-25 NOTE — Progress Notes (Signed)
Mild HA relieved with Tylenol-throughout pregnancy N/V x 1 this afternoon-happens every day      Gall stones-scheduled for cholecystectomy post partum Sore spot upper mid abdomen only if palpated  VSS Afeb Abdomen soft, uterus soft and NT  FHT Cat one BPP 8/8 x 2 this admission, normal umbilical dopplers, normal growth Labs OK, fibrinogen normal for pregnancy  A/P: D/W patient         D/C home         NST in office tomorrow

## 2015-04-25 NOTE — Progress Notes (Signed)
34 5/7 weeks  Still no fetal movement sensation for patient In U/S suite, had a single gush of fluid. No pain, no blood, no further fluid Has some upper abd/epigastric and suprapubic pressure  VSS Afeb  BP 109/76 Lungs CTA Cor RRR Abd-uterus soft, NT DTR 2+ without clonus  FHT Cat One  U/S done-Vtx, BPP 8/8  A/P: Reassuring fetal evaluation at present         Continued decreased sensation of FM         Abd/pelvic pressure-will check labs with fibrinogen         Continue EFM         R/O PROM-poss urine gush             Amniosure ordered

## 2015-04-25 NOTE — Progress Notes (Signed)
Patient in U/S FHT Cat One

## 2015-04-25 NOTE — Progress Notes (Signed)
Discharge instructions given, office has called [facility] and scheduled NST for tomorrow at 1130.

## 2015-04-25 NOTE — Discharge Summary (Signed)
Physician Discharge Summary  Patient ID: Tara Watson MRN: [id number] DOB/AGE: 18-May-1991 24 y.o.  Admit date: 04/24/2015 Discharge date: 04/25/2015  Admission Diagnoses: Decreased fetal movement                                         Cholelitiasis                                         Non-reactive NST  Discharge Diagnoses:  Active Problems:   [redacted] weeks gestation of pregnancy   Non-reactive NST (non-stress test)   Screening, antenatal, for fetal anatomic survey   Decreased fetal movement   Discharged Condition: good  Hospital Course: observed on antenatal with continuous EFM. FHT remain Cat one. On admission, U/S showed normal EFW, BPP 8/8 and normal umbilical dopplers. Today, repeat U/S with MFM showed BPP 8/8. Labs including fibrinogen are normal for pregnancy. No contractions noted on monitor. Patient had N/V x 1 this afternoon which is a daily occurrence for her. She is scheduled for cholecystectomy post partum. Also D/W Dr Lisbeth Renshaw of MFM. He agrees with outpatient management.  Consults: MFM  Significant Diagnostic Studies: labs:  Results for orders placed or performed during the hospital encounter of 04/24/15 (from the past 48 hour(s))  Amnisure rupture of membrane (rom)     Status: None   Collection Time: 04/25/15  9:57 AM  Result Value Ref Range   Amnisure ROM NEGATIVE   CBC     Status: Abnormal   Collection Time: 04/25/15 10:15 AM  Result Value Ref Range   WBC 8.7 4.0 - 10.5 K/uL   RBC 3.89 3.87 - 5.11 MIL/uL   Hemoglobin 11.8 (L) 12.0 - 15.0 g/dL   HCT 34.7 (L) 36.0 - 46.0 %   MCV 89.2 78.0 - 100.0 fL   MCH 30.3 26.0 - 34.0 pg   MCHC 34.0 30.0 - 36.0 g/dL   RDW 13.2 11.5 - 15.5 %   Platelets 175 150 - 400 K/uL  Fibrinogen     Status: Abnormal   Collection Time: 04/25/15 10:15 AM  Result Value Ref Range   Fibrinogen 543 (H) 204 - 475 mg/dL  Comprehensive metabolic panel     Status: Abnormal   Collection Time: 04/25/15 10:15 AM  Result Value Ref Range   Sodium 134 (L) 135 - 145 mmol/L   Potassium 3.4 (L) 3.5 - 5.1 mmol/L   Chloride 103 101 - 111 mmol/L   CO2 20 (L) 22 - 32 mmol/L   Glucose, Bld 138 (H) 65 - 99 mg/dL   BUN 7 6 - 20 mg/dL   Creatinine, Ser 0.54 0.44 - 1.00 mg/dL   Calcium 8.3 (L) 8.9 - 10.3 mg/dL   Total Protein 6.3 (L) 6.5 - 8.1 g/dL   Albumin 2.6 (L) 3.5 - 5.0 g/dL   AST 23 15 - 41 U/L   ALT 12 (L) 14 - 54 U/L   Alkaline Phosphatase 176 (H) 38 - 126 U/L   Total Bilirubin 0.1 (L) 0.3 - 1.2 mg/dL   GFR calc non Af Amer >60 >60 mL/min   GFR calc Af Amer >60 >60 mL/min    Comment: (NOTE) The eGFR has been calculated using the CKD EPI equation. This calculation has not been validated in all clinical situations. eGFR's persistently <  60 mL/min signify possible Chronic Kidney Disease.    Anion gap 11 5 - 15  Uric acid     Status: None   Collection Time: 04/25/15 10:15 AM  Result Value Ref Range   Uric Acid, Serum 5.2 2.3 - 6.6 mg/dL    Treatments: monitoring  Discharge Exam: Blood pressure 103/62, pulse 102, temperature 98.3 F (36.8 C), temperature source Oral, resp. rate 18, height 5' 1"  (1.549 m), weight 161 lb (73.029 kg), SpO2 100 %. General appearance: alert, cooperative and no distress GI: uterus soft and NT, mild pinpoint tenderness under upper monitor  Disposition: 01-Home or Self Care     Medication List    TAKE these medications        acetaminophen 325 MG tablet  Commonly known as:  TYLENOL  Take 650 mg by mouth every 6 (six) hours as needed for fever.     famotidine 20 MG tablet  Commonly known as:  PEPCID  Take 1 tablet (20 mg total) by mouth 2 (two) times daily.     omeprazole 40 MG capsule  Commonly known as:  PRILOSEC  Take 40 mg by mouth daily.           Follow-up Information    Follow up with MORRIS, MEGAN, DO. Schedule an appointment as soon as possible for a visit in 2 days.   Specialty:  Obstetrics and Gynecology   Why:  Call office for NST appointment this Friday    Contact information:   [address], [place], [place] [address] [phone]       Follow up In 1 day.      Signed: Sayde Lish II,Azel Gumina E 04/25/2015, 5:00 PM

## 2015-04-29 ENCOUNTER — Inpatient Hospital Stay (HOSPITAL_COMMUNITY)
Admission: AD | Admit: 2015-04-29 | Discharge: 2015-04-29 | Disposition: A | Discharge status: Home / Self Care | Payer: Medicaid Other | Source: Ambulatory Visit | Attending: Obstetrics and Gynecology | Admitting: Obstetrics and Gynecology

## 2015-04-29 ENCOUNTER — Encounter (HOSPITAL_COMMUNITY): Payer: Self-pay

## 2015-04-29 ENCOUNTER — Inpatient Hospital Stay (HOSPITAL_COMMUNITY): Payer: Medicaid Other

## 2015-04-29 DIAGNOSIS — Z36 Encounter for antenatal screening of mother: Secondary | ICD-10-CM | POA: Diagnosis not present

## 2015-04-29 DIAGNOSIS — Z3A35 35 weeks gestation of pregnancy: Secondary | ICD-10-CM | POA: Diagnosis not present

## 2015-04-29 DIAGNOSIS — O368130 Decreased fetal movements, third trimester, not applicable or unspecified: Secondary | ICD-10-CM | POA: Diagnosis not present

## 2015-04-29 DIAGNOSIS — Z3689 Encounter for other specified antenatal screening: Secondary | ICD-10-CM

## 2015-04-29 DIAGNOSIS — Z3493 Encounter for supervision of normal pregnancy, unspecified, third trimester: Secondary | ICD-10-CM

## 2015-04-29 NOTE — Discharge Instructions (Signed)

## 2015-04-29 NOTE — MAU Note (Signed)
Pt given buzzer for when she feels fm

## 2015-04-29 NOTE — MAU Note (Signed)
Pt states has been admitted for overnight obs for baby failing NST/BPP. Here for decreased FM. Has not felt baby at all today. Denies bleeding or abnormal vaginal discharge.

## 2015-04-29 NOTE — MAU Provider Note (Signed)
History     CSN: [id number]  Arrival date and time: 04/29/15 1743   First Provider Initiated Contact with Patient 04/29/15 1802      Chief Complaint  Patient presents with  . Decreased Fetal Movement   HPI  [name] is a 24 y.o. G1P0 at 6746w2d who presents to MAU today with complaint of no fetal movement today. She states she was admitted on 04/24/15 for decreased fetal movement and 4/10 BPP in the office. Repeat BPP in MAU that day was 10/10. The patient was discharged on 04/25/15 when fetal movement became more regular. She denies contractions, bleeding, LOF. She has had some N/V today, but none recently or currently. She states that she has an anterior placenta. She denies other complications with the pregnancy.   OB History    Gravida Para Term Preterm AB TAB SAB Ectopic Multiple Living   1               Past Medical History  Diagnosis Date  . Medical history non-contributory   . Migraines   . Gallstones     Past Surgical History  Procedure Laterality Date  . Tonsillectomy      History reviewed. No pertinent family history.  History  Substance Use Topics  . Smoking status: Never Smoker   . Smokeless tobacco: Never Used  . Alcohol Use: No    Allergies:  Allergies  Allergen Reactions  . Imitrex [Sumatriptan] Anaphylaxis  . Ceftin [Cefuroxime] Hives  . Codeine Hives  . Erythromycin Hives  . Sulfa Antibiotics Other (See Comments)    Gland swelling     Prescriptions prior to admission  Medication Sig Dispense Refill Last Dose  . acetaminophen (TYLENOL) 325 MG tablet Take 650 mg by mouth every 6 (six) hours as needed for moderate pain.    04/28/2015 at Unknown time  . omeprazole (PRILOSEC) 40 MG capsule Take 40 mg by mouth daily.   04/29/2015 at Unknown time  . famotidine (PEPCID) 20 MG tablet Take 1 tablet (20 mg total) by mouth 2 (two) times daily. (Patient not taking: Reported on 04/24/2015) 30 tablet 0     Review of Systems  Constitutional:  Negative for fever and malaise/fatigue.  Gastrointestinal: Negative for nausea, vomiting, abdominal pain, diarrhea and constipation.  Genitourinary:       Neg - vaginal bleeding, discharge, LOF   Physical Exam   Blood pressure 113/78, pulse 113, resp. rate 16, height 5' (1.524 m), weight 161 lb (73.029 kg).  Physical Exam  Nursing note and vitals reviewed. Constitutional: She is oriented to person, place, and time. She appears well-developed and well-nourished. No distress.  HENT:  Head: Normocephalic and atraumatic.  Cardiovascular: Normal rate.   Respiratory: Effort normal.  GI: Soft. She exhibits no distension and no mass. There is no tenderness. There is no rebound and no guarding.  Neurological: She is alert and oriented to person, place, and time.  Skin: Skin is warm and dry. No erythema.  Psychiatric: She has a normal mood and affect.    Fetal Monitoring: Baseline: 130 bpm, moderate variability, + accelerations, no decelerations Contractions: None  MAU Course  Procedures None  MDM Discussed patient with Dr. Marcelle OverlieHolland. Although NST is reactive, patient has continued to feel no fetal movement. Dr. Marcelle OverlieHolland recommends BPP today.  BPP 8/8. Discussed results with the patient. She does not feel consistent movement still at this time. She is not comfortable with plan to discharge.  Discussed patient concerns with Dr. Marcelle OverlieHolland.  He recommends continued EFM in MAU. If patient does not feel reassured, she can stay in observation overnight.  2000 - EFM placed back on patient. Patient given sprite to drink.  2020 - Patient called me into room, states that she is ready for discharge. She feels reassured that all testing shows baby is doing well. She has felt one movement since return from US and did feel some movement in US as well.   Assessment and Plan  A: SIUP at 8593w2d Reactive NST BPP 8/8 Fetal movement not palpable during pregnancy in third trimester  P: Discharge home Kick  counts discussed Patient advised to follow-up with [facility] as scheduled or sooner if symptoms worsen Patient may return to MAU as needed or if her condition were to change or worsen

## 2015-04-30 DIAGNOSIS — O368130 Decreased fetal movements, third trimester, not applicable or unspecified: Secondary | ICD-10-CM | POA: Insufficient documentation

## 2015-04-30 DIAGNOSIS — Z3A35 35 weeks gestation of pregnancy: Secondary | ICD-10-CM | POA: Insufficient documentation

## 2015-05-02 ENCOUNTER — Other Ambulatory Visit (HOSPITAL_COMMUNITY): Payer: Self-pay | Admitting: Obstetrics & Gynecology

## 2015-05-02 DIAGNOSIS — O368130 Decreased fetal movements, third trimester, not applicable or unspecified: Secondary | ICD-10-CM

## 2015-05-02 LAB — US OB FOLLOW UP

## 2015-05-03 ENCOUNTER — Encounter (HOSPITAL_COMMUNITY): Payer: Self-pay

## 2015-05-03 ENCOUNTER — Ambulatory Visit (HOSPITAL_COMMUNITY)
Admission: RE | Admit: 2015-05-03 | Discharge: 2015-05-03 | Disposition: A | Discharge status: Home / Self Care | Payer: Medicaid Other | Source: Ambulatory Visit | Attending: Obstetrics & Gynecology | Admitting: Obstetrics & Gynecology

## 2015-05-03 DIAGNOSIS — Z3A35 35 weeks gestation of pregnancy: Secondary | ICD-10-CM | POA: Diagnosis not present

## 2015-05-03 DIAGNOSIS — O368130 Decreased fetal movements, third trimester, not applicable or unspecified: Secondary | ICD-10-CM | POA: Diagnosis present

## 2015-05-03 DIAGNOSIS — O283 Abnormal ultrasonic finding on antenatal screening of mother: Secondary | ICD-10-CM | POA: Diagnosis not present

## 2015-05-06 ENCOUNTER — Other Ambulatory Visit (HOSPITAL_COMMUNITY): Payer: Self-pay | Admitting: Obstetrics & Gynecology

## 2015-05-06 ENCOUNTER — Encounter (HOSPITAL_COMMUNITY): Payer: Self-pay | Admitting: Obstetrics & Gynecology

## 2015-05-06 LAB — OB RESULTS CONSOLE GBS: GBS: NEGATIVE

## 2015-05-06 LAB — OB RESULTS CONSOLE GC/CHLAMYDIA
CHLAMYDIA, DNA PROBE: NEGATIVE
Gonorrhea: NEGATIVE

## 2015-05-16 ENCOUNTER — Inpatient Hospital Stay (HOSPITAL_COMMUNITY)
Admission: AD | Admit: 2015-05-16 | Discharge: 2015-05-19 | DRG: 775 | Disposition: A | Discharge status: Home / Self Care | Payer: Medicaid Other | Source: Ambulatory Visit | Attending: Obstetrics and Gynecology | Admitting: Obstetrics and Gynecology

## 2015-05-16 ENCOUNTER — Encounter (HOSPITAL_COMMUNITY): Payer: Self-pay | Admitting: *Deleted

## 2015-05-16 ENCOUNTER — Inpatient Hospital Stay (HOSPITAL_COMMUNITY): Payer: Medicaid Other | Admitting: Anesthesiology

## 2015-05-16 ENCOUNTER — Encounter (HOSPITAL_COMMUNITY): Payer: Self-pay

## 2015-05-16 ENCOUNTER — Inpatient Hospital Stay (HOSPITAL_COMMUNITY)
Admission: AD | Admit: 2015-05-16 | Discharge: 2015-05-16 | Disposition: A | Discharge status: Home / Self Care | Payer: Medicaid Other | Source: Ambulatory Visit | Attending: Obstetrics and Gynecology | Admitting: Obstetrics and Gynecology

## 2015-05-16 DIAGNOSIS — IMO0001 Reserved for inherently not codable concepts without codable children: Secondary | ICD-10-CM

## 2015-05-16 DIAGNOSIS — Z3A37 37 weeks gestation of pregnancy: Secondary | ICD-10-CM | POA: Diagnosis present

## 2015-05-16 LAB — CBC
HCT: 38.1 % (ref 36.0–46.0)
Hemoglobin: 13.1 g/dL (ref 12.0–15.0)
MCH: 30.3 pg (ref 26.0–34.0)
MCHC: 34.4 g/dL (ref 30.0–36.0)
MCV: 88.2 fL (ref 78.0–100.0)
Platelets: 176 10*3/uL (ref 150–400)
RBC: 4.32 MIL/uL (ref 3.87–5.11)
RDW: 13.1 % (ref 11.5–15.5)
WBC: 19.7 10*3/uL — ABNORMAL HIGH (ref 4.0–10.5)

## 2015-05-16 LAB — TYPE AND SCREEN
ABO/RH(D): O POS
Antibody Screen: NEGATIVE

## 2015-05-16 MED ORDER — FENTANYL 2.5 MCG/ML BUPIVACAINE 1/10 % EPIDURAL INFUSION (WH - ANES)
14.0000 mL/h | INTRAMUSCULAR | Status: DC | PRN
  Administered 2015-05-16: 12 mL/h via EPIDURAL
  Administered 2015-05-16 (×2): 14 mL/h via EPIDURAL
  Filled 2015-05-16 (×2): qty 125

## 2015-05-16 MED ORDER — OXYTOCIN 40 UNITS IN LACTATED RINGERS INFUSION - SIMPLE MED
62.5000 mL/h | INTRAVENOUS | Status: DC
  Filled 2015-05-16: qty 1000

## 2015-05-16 MED ORDER — ONDANSETRON HCL 4 MG/2ML IJ SOLN
4.0000 mg | Freq: Four times a day (QID) | INTRAMUSCULAR | Status: DC | PRN
Start: 2015-05-16 — End: 2015-05-17

## 2015-05-16 MED ORDER — LIDOCAINE HCL (PF) 1 % IJ SOLN
30.0000 mL | INTRAMUSCULAR | Status: AC | PRN
  Administered 2015-05-17: 30 mL via SUBCUTANEOUS
  Filled 2015-05-16: qty 30

## 2015-05-16 MED ORDER — LACTATED RINGERS IV SOLN
INTRAVENOUS | Status: DC
  Administered 2015-05-16 (×2): via INTRAVENOUS

## 2015-05-16 MED ORDER — EPHEDRINE 5 MG/ML INJ
10.0000 mg | INTRAVENOUS | Status: DC | PRN
  Filled 2015-05-16: qty 2

## 2015-05-16 MED ORDER — FLEET ENEMA 7-19 GM/118ML RE ENEM
1.0000 | ENEMA | RECTAL | Status: DC | PRN
Start: 2015-05-16 — End: 2015-05-17

## 2015-05-16 MED ORDER — OXYTOCIN BOLUS FROM INFUSION
500.0000 mL | INTRAVENOUS | Status: DC
  Administered 2015-05-17: 500 mL via INTRAVENOUS

## 2015-05-16 MED ORDER — CITRIC ACID-SODIUM CITRATE 334-500 MG/5ML PO SOLN: 30.0000 mL | ORAL | Status: DC | PRN

## 2015-05-16 MED ORDER — FENTANYL 2.5 MCG/ML BUPIVACAINE 1/10 % EPIDURAL INFUSION (WH - ANES): 12.0000 mL/h | INTRAMUSCULAR | Status: DC | PRN

## 2015-05-16 MED ORDER — TERBUTALINE SULFATE 1 MG/ML IJ SOLN: 0.2500 mg | Freq: Once | INTRAMUSCULAR | Status: AC | PRN

## 2015-05-16 MED ORDER — OXYTOCIN 40 UNITS IN LACTATED RINGERS INFUSION - SIMPLE MED
1.0000 m[IU]/min | INTRAVENOUS | Status: DC
  Administered 2015-05-16: 1 m[IU]/min via INTRAVENOUS

## 2015-05-16 MED ORDER — PHENYLEPHRINE 40 MCG/ML (10ML) SYRINGE FOR IV PUSH (FOR BLOOD PRESSURE SUPPORT)
80.0000 ug | PREFILLED_SYRINGE | INTRAVENOUS | Status: DC | PRN
  Filled 2015-05-16: qty 20
  Filled 2015-05-16: qty 2

## 2015-05-16 MED ORDER — ACETAMINOPHEN 325 MG PO TABS
650.0000 mg | ORAL_TABLET | ORAL | Status: DC | PRN
  Administered 2015-05-16: 650 mg via ORAL
  Filled 2015-05-16: qty 2

## 2015-05-16 MED ORDER — PHENYLEPHRINE 40 MCG/ML (10ML) SYRINGE FOR IV PUSH (FOR BLOOD PRESSURE SUPPORT)
80.0000 ug | PREFILLED_SYRINGE | INTRAVENOUS | Status: DC | PRN
  Administered 2015-05-16 (×2): 80 ug via INTRAVENOUS
  Filled 2015-05-16: qty 2

## 2015-05-16 MED ORDER — DIPHENHYDRAMINE HCL 50 MG/ML IJ SOLN: 12.5000 mg | INTRAMUSCULAR | Status: DC | PRN

## 2015-05-16 MED ORDER — LIDOCAINE HCL (PF) 1 % IJ SOLN
INTRAMUSCULAR | Status: DC | PRN
  Administered 2015-05-16: 3 mL
  Administered 2015-05-16: 4 mL

## 2015-05-16 MED ORDER — LACTATED RINGERS IV SOLN
500.0000 mL | INTRAVENOUS | Status: DC | PRN
  Administered 2015-05-16 (×2): 500 mL via INTRAVENOUS

## 2015-05-16 MED ORDER — OXYCODONE-ACETAMINOPHEN 5-325 MG PO TABS: 2.0000 | ORAL_TABLET | ORAL | Status: DC | PRN

## 2015-05-16 MED ORDER — OXYCODONE-ACETAMINOPHEN 5-325 MG PO TABS: 1.0000 | ORAL_TABLET | ORAL | Status: DC | PRN

## 2015-05-16 NOTE — Progress Notes (Signed)
Cx C/C/+3/ROP FHT cat one

## 2015-05-16 NOTE — Progress Notes (Signed)
9/C/0/directly OP FHT 150-160s, + accel with scalp stim UCs q2-4 min Temp 99.2 Tylenol now

## 2015-05-16 NOTE — Discharge Instructions (Signed)
Braxton Hicks Contractions °Contractions of the uterus can occur throughout pregnancy. Contractions are not always a sign that you are in labor.  °WHAT ARE BRAXTON HICKS CONTRACTIONS?  °Contractions that occur before labor are called Braxton Hicks contractions, or false labor. Toward the end of pregnancy (32-34 weeks), these contractions can develop more often and may become more forceful. This is not true labor because these contractions do not result in opening (dilatation) and thinning of the cervix. They are sometimes difficult to tell apart from true labor because these contractions can be forceful and people have different pain tolerances. You should not feel embarrassed if you go to the hospital with false labor. Sometimes, the only way to tell if you are in true labor is for your health care provider to look for changes in the cervix. °If there are no prenatal problems or other health problems associated with the pregnancy, it is completely safe to be sent home with false labor and await the onset of true labor. °HOW CAN YOU TELL THE DIFFERENCE BETWEEN TRUE AND FALSE LABOR? °False Labor °· The contractions of false labor are usually shorter and not as hard as those of true labor.   °· The contractions are usually irregular.   °· The contractions are often felt in the front of the lower abdomen and in the groin.   °· The contractions may go away when you walk around or change positions while lying down.   °· The contractions get weaker and are shorter lasting as time goes on.   °· The contractions do not usually become progressively stronger, regular, and closer together as with true labor.   °True Labor °1. Contractions in true labor last 30-70 seconds, become very regular, usually become more intense, and increase in frequency.   °2. The contractions do not go away with walking.   °3. The discomfort is usually felt in the top of the uterus and spreads to the lower abdomen and low back.   °4. True labor can  be determined by your health care provider with an exam. This will show that the cervix is dilating and getting thinner.   °WHAT TO REMEMBER °· Keep up with your usual exercises and follow other instructions given by your health care provider.   °· Take medicines as directed by your health care provider.   °· Keep your regular prenatal appointments.   °· Eat and drink lightly if you think you are going into labor.   °· If Braxton Hicks contractions are making you uncomfortable:   °· Change your position from lying down or resting to walking, or from walking to resting.   °· Sit and rest in a tub of warm water.   °· Drink 2-3 glasses of water. Dehydration may cause these contractions.   °· Do slow and deep breathing several times an hour.   °WHEN SHOULD I SEEK IMMEDIATE MEDICAL CARE? °Seek immediate medical care if: °· Your contractions become stronger, more regular, and closer together.   °· You have fluid leaking or gushing from your vagina.   °· You have a fever.   °· You pass blood-tinged mucus.   °· You have vaginal bleeding.   °· You have continuous abdominal pain.   °· You have low back pain that you never had before.   °· You feel your baby's head pushing down and causing pelvic pressure.   °· Your baby is not moving as much as it used to.   °Document Released: 11/30/2005 Document Revised: 12/05/2013 Document Reviewed: 09/11/2013 °[employer]® Patient Information ©2015 [employer]. This information is not intended to replace advice given to you by your health care   provider. Make sure you discuss any questions you have with your health care provider. ° °Fetal Movement Counts °Patient Name: __________________________________________________ Patient Due Date: ____________________ °Performing a fetal movement count is highly recommended in high-risk pregnancies, but it is good for every pregnant woman to do. Your health care provider may ask you to start counting fetal movements at 28 weeks of the pregnancy. Fetal  movements often increase: °· After eating a full meal. °· After physical activity. °· After eating or drinking something sweet or cold. °· At rest. °Pay attention to when you feel the baby is most active. This will help you notice a pattern of your baby's sleep and wake cycles and what factors contribute to an increase in fetal movement. It is important to perform a fetal movement count at the same time each day when your baby is normally most active.  °HOW TO COUNT FETAL MOVEMENTS °5. Find a quiet and comfortable area to sit or lie down on your left side. Lying on your left side provides the best blood and oxygen circulation to your baby. °6. Write down the day and time on a sheet of paper or in a journal. °7. Start counting kicks, flutters, swishes, rolls, or jabs in a 2-hour period. You should feel at least 10 movements within 2 hours. °8. If you do not feel 10 movements in 2 hours, wait 2-3 hours and count again. Look for a change in the pattern or not enough counts in 2 hours. °SEEK MEDICAL CARE IF: °· You feel less than 10 counts in 2 hours, tried twice. °· There is no movement in over an hour. °· The pattern is changing or taking longer each day to reach 10 counts in 2 hours. °· You feel the baby is not moving as he or she usually does. °Date: ____________ Movements: ____________ Start time: ____________ Finish time: ____________  °Date: ____________ Movements: ____________ Start time: ____________ Finish time: ____________ °Date: ____________ Movements: ____________ Start time: ____________ Finish time: ____________ °Date: ____________ Movements: ____________ Start time: ____________ Finish time: ____________ °Date: ____________ Movements: ____________ Start time: ____________ Finish time: ____________ °Date: ____________ Movements: ____________ Start time: ____________ Finish time: ____________ °Date: ____________ Movements: ____________ Start time: ____________ Finish time: ____________ °Date: ____________  Movements: ____________ Start time: ____________ Finish time: ____________  °Date: ____________ Movements: ____________ Start time: ____________ Finish time: ____________ °Date: ____________ Movements: ____________ Start time: ____________ Finish time: ____________ °Date: ____________ Movements: ____________ Start time: ____________ Finish time: ____________ °Date: ____________ Movements: ____________ Start time: ____________ Finish time: ____________ °Date: ____________ Movements: ____________ Start time: ____________ Finish time: ____________ °Date: ____________ Movements: ____________ Start time: ____________ Finish time: ____________ °Date: ____________ Movements: ____________ Start time: ____________ Finish time: ____________  °Date: ____________ Movements: ____________ Start time: ____________ Finish time: ____________ °Date: ____________ Movements: ____________ Start time: ____________ Finish time: ____________ °Date: ____________ Movements: ____________ Start time: ____________ Finish time: ____________ °Date: ____________ Movements: ____________ Start time: ____________ Finish time: ____________ °Date: ____________ Movements: ____________ Start time: ____________ Finish time: ____________ °Date: ____________ Movements: ____________ Start time: ____________ Finish time: ____________ °Date: ____________ Movements: ____________ Start time: ____________ Finish time: ____________  °Date: ____________ Movements: ____________ Start time: ____________ Finish time: ____________ °Date: ____________ Movements: ____________ Start time: ____________ Finish time: ____________ °Date: ____________ Movements: ____________ Start time: ____________ Finish time: ____________ °Date: ____________ Movements: ____________ Start time: ____________ Finish time: ____________ °Date: ____________ Movements: ____________ Start time: ____________ Finish time: ____________ °Date: ____________ Movements: ____________ Start time:  ____________ Finish time: ____________ °Date: ____________ Movements:   ____________ Start time: ____________ Finish time: ____________  °Date: ____________ Movements: ____________ Start time: ____________ Finish time: ____________ °Date: ____________ Movements: ____________ Start time: ____________ Finish time: ____________ °Date: ____________ Movements: ____________ Start time: ____________ Finish time: ____________ °Date: ____________ Movements: ____________ Start time: ____________ Finish time: ____________ °Date: ____________ Movements: ____________ Start time: ____________ Finish time: ____________ °Date: ____________ Movements: ____________ Start time: ____________ Finish time: ____________ °Date: ____________ Movements: ____________ Start time: ____________ Finish time: ____________  °Date: ____________ Movements: ____________ Start time: ____________ Finish time: ____________ °Date: ____________ Movements: ____________ Start time: ____________ Finish time: ____________ °Date: ____________ Movements: ____________ Start time: ____________ Finish time: ____________ °Date: ____________ Movements: ____________ Start time: ____________ Finish time: ____________ °Date: ____________ Movements: ____________ Start time: ____________ Finish time: ____________ °Date: ____________ Movements: ____________ Start time: ____________ Finish time: ____________ °Date: ____________ Movements: ____________ Start time: ____________ Finish time: ____________  °Date: ____________ Movements: ____________ Start time: ____________ Finish time: ____________ °Date: ____________ Movements: ____________ Start time: ____________ Finish time: ____________ °Date: ____________ Movements: ____________ Start time: ____________ Finish time: ____________ °Date: ____________ Movements: ____________ Start time: ____________ Finish time: ____________ °Date: ____________ Movements: ____________ Start time: ____________ Finish time: ____________ °Date:  ____________ Movements: ____________ Start time: ____________ Finish time: ____________ °Date: ____________ Movements: ____________ Start time: ____________ Finish time: ____________  °Date: ____________ Movements: ____________ Start time: ____________ Finish time: ____________ °Date: ____________ Movements: ____________ Start time: ____________ Finish time: ____________ °Date: ____________ Movements: ____________ Start time: ____________ Finish time: ____________ °Date: ____________ Movements: ____________ Start time: ____________ Finish time: ____________ °Date: ____________ Movements: ____________ Start time: ____________ Finish time: ____________ °Date: ____________ Movements: ____________ Start time: ____________ Finish time: ____________ °Document Released: 12/30/2006 Document Revised: 04/16/2014 Document Reviewed: 09/26/2012 °[employer]® Patient Information ©2015 [employer]. This information is not intended to replace advice given to you by your health care provider. Make sure you discuss any questions you have with your health care provider. ° °

## 2015-05-16 NOTE — H&P (Signed)
Tara AhmadiKailey B Watson is a 24 y.o. female presenting for UCs. No ROM, no HA or vision change. Maternal Medical History:  Reason for admission: Contractions.   Contractions: Onset was 3-5 hours ago.    Fetal activity: Perceived fetal activity is normal.      OB History    Gravida Para Term Preterm AB TAB SAB Ectopic Multiple Living   1 0 0 0 0 0 0 0 0 0      Past Medical History  Diagnosis Date  . Migraines   . Gallstones    Past Surgical History  Procedure Laterality Date  . Tonsillectomy    . Tympanostomy tube placement     Family History: family history is not on file. Social History:  reports that she has never smoked. She has never used smokeless tobacco. She reports that she does not drink alcohol or use illicit drugs.   Prenatal Transfer Tool  Maternal Diabetes: No Genetic Screening: Normal Maternal Ultrasounds/Referrals: Normal Fetal Ultrasounds or other Referrals:  None Maternal Substance Abuse:  No Significant Maternal Medications:  None Significant Maternal Lab Results:  None Other Comments:  None  Review of Systems  Eyes: Negative for blurred vision.  Gastrointestinal: Negative for abdominal pain.  Neurological: Negative for headaches.    Dilation: 4 Effacement (%): 100 Station: -1 Exam by:: Dr. Henderson Cloudomblin Blood pressure 91/64, pulse 105, temperature 97.6 F (36.4 C), temperature source Oral, resp. rate 20, height 5' (1.524 m), weight 164 lb (74.39 kg), last menstrual period 08/25/2014, SpO2 100 %. Maternal Exam:  Uterine Assessment: Contraction strength is firm.  Contraction frequency is regular.   Abdomen: Patient reports no abdominal tenderness. Fetal presentation: vertex     Fetal Exam Fetal State Assessment: Category I - tracings are normal.     Physical Exam  Cardiovascular: Normal rate and regular rhythm.   Respiratory: Effort normal and breath sounds normal.  GI: Soft. There is no tenderness.  Neurological: She has normal reflexes.      AROM clear   Prenatal labs: ABO, Rh: --/--/O POS (01/02 0020) Antibody: Negative (11/12 0000) Rubella:   RPR: Nonreactive (11/12 0000)  HBsAg: Negative (11/12 0000)  HIV: Non-reactive (11/12 0000)  GBS:     Assessment/Plan: 24 yo G1P0 in active labor   Tara Watson,Tara Watson 05/16/2015, 2:32 PM

## 2015-05-16 NOTE — Anesthesia Preprocedure Evaluation (Signed)
Anesthesia Evaluation  Patient identified by MRN, date of birth, ID band Patient awake    Reviewed: Allergy & Precautions, Patient's Chart, lab work & pertinent test results  Airway Mallampati: II  TM Distance: >3 FB Neck ROM: Full    Dental no notable dental hx. (+) Teeth Intact   Pulmonary neg pulmonary ROS,  breath sounds clear to auscultation  Pulmonary exam normal       Cardiovascular negative cardio ROS Normal cardiovascular examRhythm:Regular Rate:Normal     Neuro/Psych  Headaches, negative psych ROS   GI/Hepatic Neg liver ROS, GERD-  Medicated and Controlled,  Endo/Other  Obesity  Renal/GU negative Renal ROS  negative genitourinary   Musculoskeletal negative musculoskeletal ROS (+)   Abdominal (+) + obese,   Peds  Hematology  (+) anemia ,   Anesthesia Other Findings   Reproductive/Obstetrics (+) Pregnancy                             Anesthesia Physical Anesthesia Plan  ASA: II  Anesthesia Plan: Epidural   Post-op Pain Management:    Induction:   Airway Management Planned: Natural Airway  Additional Equipment:   Intra-op Plan:   Post-operative Plan:   Informed Consent: I have reviewed the patients History and Physical, chart, labs and discussed the procedure including the risks, benefits and alternatives for the proposed anesthesia with the patient or authorized representative who has indicated his/her understanding and acceptance.     Plan Discussed with: Anesthesiologist  Anesthesia Plan Comments:         Anesthesia Quick Evaluation

## 2015-05-16 NOTE — Anesthesia Procedure Notes (Signed)
Epidural Patient location during procedure: OB Start time: 05/16/2015 2:10 PM  Staffing Anesthesiologist: Mal AmabileFOSTER, Akaiya Touchette Performed by: anesthesiologist   Preanesthetic Checklist Completed: patient identified, site marked, surgical consent, pre-op evaluation, timeout performed, IV checked, risks and benefits discussed and monitors and equipment checked  Epidural Patient position: sitting Prep: site prepped and draped and DuraPrep Patient monitoring: continuous pulse ox and blood pressure Approach: midline Location: L3-L4 Injection technique: LOR air  Needle:  Needle type: Tuohy  Needle gauge: 17 G Needle length: 9 cm and 9 Needle insertion depth: 5 cm cm Catheter type: closed end flexible Catheter size: 19 Gauge Catheter at skin depth: 10 cm Test dose: negative and Other  Assessment Events: blood not aspirated, injection not painful, no injection resistance, negative IV test and no paresthesia  Additional Notes Patient identified. Risks and benefits discussed including failed block, incomplete  Pain control, post dural puncture headache, nerve damage, paralysis, blood pressure Changes, nausea, vomiting, reactions to medications-both toxic and allergic and post Partum back pain. All questions were answered. Patient expressed understanding and wished to proceed. Sterile technique was used throughout procedure. Epidural site was Dressed with sterile barrier dressing. No paresthesias, signs of intravascular injection Or signs of intrathecal spread were encountered.  Patient was more comfortable after the epidural was dosed. Please see RN's note for documentation of vital signs and FHR which are stable.

## 2015-05-16 NOTE — MAU Note (Signed)
Pt presents with contractions. Denies bleeding or ROM

## 2015-05-17 ENCOUNTER — Encounter (HOSPITAL_COMMUNITY): Payer: Self-pay

## 2015-05-17 LAB — RPR: RPR Ser Ql: NONREACTIVE

## 2015-05-17 MED ORDER — IBUPROFEN 600 MG PO TABS
600.0000 mg | ORAL_TABLET | Freq: Four times a day (QID) | ORAL | Status: DC
  Administered 2015-05-17 – 2015-05-19 (×9): 600 mg via ORAL
  Filled 2015-05-17 (×10): qty 1

## 2015-05-17 MED ORDER — ONDANSETRON HCL 4 MG PO TABS: 4.0000 mg | ORAL_TABLET | ORAL | Status: DC | PRN

## 2015-05-17 MED ORDER — FLEET ENEMA 7-19 GM/118ML RE ENEM: 1.0000 | ENEMA | Freq: Every day | RECTAL | Status: DC | PRN

## 2015-05-17 MED ORDER — WITCH HAZEL-GLYCERIN EX PADS: 1.0000 "application " | MEDICATED_PAD | CUTANEOUS | Status: DC | PRN

## 2015-05-17 MED ORDER — ZOLPIDEM TARTRATE 5 MG PO TABS: 5.0000 mg | ORAL_TABLET | Freq: Every evening | ORAL | Status: DC | PRN

## 2015-05-17 MED ORDER — BISACODYL 10 MG RE SUPP: 10.0000 mg | Freq: Every day | RECTAL | Status: DC | PRN

## 2015-05-17 MED ORDER — SIMETHICONE 80 MG PO CHEW: 80.0000 mg | CHEWABLE_TABLET | ORAL | Status: DC | PRN

## 2015-05-17 MED ORDER — BENZOCAINE-MENTHOL 20-0.5 % EX AERO
1.0000 "application " | INHALATION_SPRAY | CUTANEOUS | Status: DC | PRN
  Administered 2015-05-17: 1 via TOPICAL
  Filled 2015-05-17: qty 56

## 2015-05-17 MED ORDER — ONDANSETRON HCL 4 MG/2ML IJ SOLN: 4.0000 mg | INTRAMUSCULAR | Status: DC | PRN

## 2015-05-17 MED ORDER — PRENATAL MULTIVITAMIN CH
1.0000 | ORAL_TABLET | Freq: Every day | ORAL | Status: DC
  Filled 2015-05-17 (×2): qty 1

## 2015-05-17 MED ORDER — SENNOSIDES-DOCUSATE SODIUM 8.6-50 MG PO TABS
2.0000 | ORAL_TABLET | ORAL | Status: DC
  Administered 2015-05-19: 2 via ORAL
  Filled 2015-05-17 (×2): qty 2

## 2015-05-17 MED ORDER — DIPHENHYDRAMINE HCL 25 MG PO CAPS: 25.0000 mg | ORAL_CAPSULE | Freq: Four times a day (QID) | ORAL | Status: DC | PRN

## 2015-05-17 MED ORDER — ACETAMINOPHEN 325 MG PO TABS
650.0000 mg | ORAL_TABLET | ORAL | Status: DC | PRN
  Administered 2015-05-17: 650 mg via ORAL
  Filled 2015-05-17: qty 2

## 2015-05-17 MED ORDER — DIBUCAINE 1 % RE OINT: 1.0000 "application " | TOPICAL_OINTMENT | RECTAL | Status: DC | PRN

## 2015-05-17 MED ORDER — LANOLIN HYDROUS EX OINT: TOPICAL_OINTMENT | CUTANEOUS | Status: DC | PRN

## 2015-05-17 MED ORDER — OXYCODONE-ACETAMINOPHEN 5-325 MG PO TABS: 1.0000 | ORAL_TABLET | ORAL | Status: DC | PRN

## 2015-05-17 MED ORDER — TETANUS-DIPHTH-ACELL PERTUSSIS 5-2.5-18.5 LF-MCG/0.5 IM SUSP: 0.5000 mL | Freq: Once | INTRAMUSCULAR | Status: DC

## 2015-05-17 MED ORDER — OXYCODONE-ACETAMINOPHEN 5-325 MG PO TABS: 2.0000 | ORAL_TABLET | ORAL | Status: DC | PRN

## 2015-05-17 NOTE — Lactation Note (Signed)
This note was copied from the chart of Tara Norva PavlovKailey Darius. Lactation Consultation Note New mom states BF going well. Had a room full of company. Denies painful latches. Asked to call for RN to see latch. Mom has Gallstones and is having surgery in a month or so. Her surgeon told her she had to pump and dump for 2 weeks and that she shouldn't even BF because it will be to hard on the baby's stomach to switch back and forth from breast milk to formula. Mom wanted to know if this was true or not.  I explained that for most surgeries you can BF right when you get back into the room if you are feeling up to it. Encouraged her to be pumping to obtain a storage in the freezer so she wouldn't have to use formula if for some reason she wasn't feeling good after surgery. Explained for most pain medications you can BF. Mom can call with a list of medications to see if ok to BF while taking. Encouraged fluids during that time to assist in flushing medications out of body.  Mom didn't think that was true that she couldn't BF and thought that Dr. Jannifer HickWasn't a BF advocate.  Encouraged to consult w/Pediatrician requarding this situation.  Mom encouraged to feed baby 8-12 times/24 hours and with feeding cues. Mom encouraged to waken baby for feeds. Educated about newborn behavior. Mom encouraged to do skin-to-skin. Discussed supply and demand and the importance of I&O. Referred to Baby and Me Book in Breastfeeding section Pg. 22-23 for position options and Proper latch demonstration.[facility] brochure given w/resources, support groups and LC services. Patient Name: Tara Watson ZOXWR'UToday's Date: 05/17/2015 Reason for consult: Initial assessment   Maternal Data Has patient been taught Hand Expression?: Yes Does the patient have breastfeeding experience prior to this delivery?: No  Feeding Feeding Type: Breast Fed Length of feed: 5 min  LATCH Score/Interventions Latch: Grasps breast easily, tongue down, lips flanged,  rhythmical sucking.  Audible Swallowing: A few with stimulation Intervention(s): Skin to skin;Hand expression  Type of Nipple: Everted at rest and after stimulation  Comfort (Breast/Nipple): Soft / non-tender     Hold (Positioning): Assistance needed to correctly position infant at breast and maintain latch. Intervention(s): Breastfeeding basics reviewed;Support Pillows;Position options;Skin to skin  LATCH Score: 8  Lactation Tools Discussed/Used     Consult Status Consult Status: Follow-up Date: 05/18/15 Follow-up type: In-patient    Charyl DancerCARVER, Aamira Bischoff G 05/17/2015, 5:38 AM

## 2015-05-17 NOTE — Progress Notes (Signed)
Post Partum Day 0  Subjective: no complaints, up ad lib, voiding and tolerating PO  Objective: Blood pressure 110/81, pulse 102, temperature 98.7 F (37.1 C), temperature source Oral, resp. rate 18, height 5' (1.524 m), weight 164 lb (74.39 kg), last menstrual period 08/25/2014, SpO2 100 %, unknown if currently breastfeeding.  Physical Exam:  General: alert, cooperative and appears stated age Lochia: appropriate Uterine Fundus: firm Incision: healing well, no significant drainage, no dehiscence DVT Evaluation: No evidence of DVT seen on physical exam. Negative Homan's sign. No cords or calf tenderness.   Recent Labs  05/16/15 1325  HGB 13.1  HCT 38.1    Assessment/Plan: Plan for discharge tomorrow and Circumcision prior to discharge   LOS: 1 day   Cebastian Neis 05/17/2015, 8:30 AM

## 2015-05-17 NOTE — Lactation Note (Signed)
This note was copied from the chart of Tara Watson. Lactation Consultation Note; Asked by RN to talk with mom. Asking about giving formula. She is concerned that baby has not nursed since 5 am. Baby asleep at present. Mom reports that baby nursed well after delivery. Reviewed normal newborn behavior the first 24 hours. Attempted to latch baby but he is too sleepy. Reviewed feeding cues and encouraged to feed whenever she sees them. Encouraged to take a nap while baby is sleepy. No questions at present. To call prn for asssist  Patient Name: Tara Norva PavlovKailey Ciocca [id number]'s Date: 05/17/2015 Reason for consult: Follow-up assessment   Maternal Data Formula Feeding for Exclusion: Yes Reason for exclusion: Mother's choice to formula feed on admision Has patient been taught Hand Expression?: Yes Does the patient have breastfeeding experience prior to this delivery?: No  Feeding Feeding Type: Breast Fed  LATCH Score/Interventions Latch: Too sleepy or reluctant, no latch achieved, no sucking elicited.  Audible Swallowing: None  Type of Nipple: Everted at rest and after stimulation  Comfort (Breast/Nipple): Soft / non-tender     Hold (Positioning): Assistance needed to correctly position infant at breast and maintain latch. Intervention(s): Breastfeeding basics reviewed  LATCH Score: 5  Lactation Tools Discussed/Used     Consult Status Consult Status: Follow-up Date: 05/18/15 Follow-up type: In-patient    Pamelia HoitWeeks, Garan Frappier D 05/17/2015, 11:35 AM

## 2015-05-17 NOTE — Progress Notes (Signed)
Operative Delivery Note At 1:47 AM a viable female was delivered via Vaginal, Vacuum Investment banker, operational(Extractor).  Presentation: vertex; Position: Left,, Occiput,, Posterior; Station: +4.  Verbal consent: obtained from patient.  Risks and benefits discussed in detail.  Risks include, but are not limited to the risks of anesthesia, bleeding, infection, damage to maternal tissues, fetal cephalhematoma.  There is also the risk of inability to effect vaginal delivery of the head, or shoulder dystocia that cannot be resolved by established maneuvers, leading to the need for emergency cesarean section.  APGAR: 8, 9; weight  .   Placenta status: Intact, Spontaneous Pathology.   Cord: 3 vessels with the following complications: Short.  Cord pH: pending  Anesthesia: Epidural  Instruments: Kiwi VE, no pop offs Episiotomy: Median Lacerations: None Suture Repair: 2.0 vicryl rapide Est. Blood Loss (mL):    Mom to postpartum.  Baby to Couplet care / Skin to Skin.  Nyxon Strupp II,Piya Mesch E 05/17/2015, 2:03 AM

## 2015-05-17 NOTE — Anesthesia Postprocedure Evaluation (Signed)
  Anesthesia Post-op Note  Patient: Lenon AhmadiKailey B Brawley  Procedure(s) Performed: * No procedures listed *  Patient Location: Mother/Baby  Anesthesia Type:Epidural  Level of Consciousness: awake, alert , oriented and patient cooperative  Airway and Oxygen Therapy: Patient Spontanous Breathing  Post-op Pain: none  Post-op Assessment: Post-op Vital signs reviewed, Patient's Cardiovascular Status Stable, Respiratory Function Stable, Patent Airway, No headache, No backache, No residual numbness and No residual motor weakness  Post-op Vital Signs: Reviewed and stable  Last Vitals:  Filed Vitals:   05/17/15 0530  BP: 110/81  Pulse: 102  Temp: 37.1 C  Resp: 18    Complications: No apparent anesthesia complications

## 2015-05-18 LAB — CBC
HEMATOCRIT: 31.9 % — AB (ref 36.0–46.0)
HEMOGLOBIN: 10.6 g/dL — AB (ref 12.0–15.0)
MCH: 29.8 pg (ref 26.0–34.0)
MCHC: 33.2 g/dL (ref 30.0–36.0)
MCV: 89.6 fL (ref 78.0–100.0)
PLATELETS: 180 10*3/uL (ref 150–400)
RBC: 3.56 MIL/uL — ABNORMAL LOW (ref 3.87–5.11)
RDW: 13.4 % (ref 11.5–15.5)
WBC: 14.1 10*3/uL — AB (ref 4.0–10.5)

## 2015-05-18 NOTE — Progress Notes (Signed)
Post Partum Day 1 Subjective: no complaints, up ad lib, voiding and tolerating PO.  Wants circ today.  Objective: Blood pressure 104/75, pulse 88, temperature 98 F (36.7 C), temperature source Oral, resp. rate 16, height 5' (1.524 m), weight 164 lb (74.39 kg), last menstrual period 08/25/2014, SpO2 100 %, unknown if currently breastfeeding.  Physical Exam:  General: alert, cooperative and appears stated age Lochia: appropriate Uterine Fundus: firm Incision: healing well, no significant drainage, no dehiscence DVT Evaluation: No evidence of DVT seen on physical exam. Negative Homan's sign. No cords or calf tenderness.   Recent Labs  05/16/15 1325 05/18/15 0600  HGB 13.1 10.6*  HCT 38.1 31.9*    Assessment/Plan: Plan for discharge tomorrow and Circumcision prior to discharge  Counseled for circ including risk of bleeding, infection, and scarring.  All questions were answered and the patient wishes to proceed.   LOS: 2 days   Arushi Partridge 05/18/2015, 9:44 AM

## 2015-05-19 MED ORDER — IBUPROFEN 600 MG PO TABS: 600.0000 mg | ORAL_TABLET | Freq: Four times a day (QID) | ORAL | Status: DC

## 2015-05-19 MED ORDER — OXYCODONE-ACETAMINOPHEN 5-325 MG PO TABS: 1.0000 | ORAL_TABLET | ORAL | Status: DC | PRN

## 2015-05-19 NOTE — Discharge Summary (Signed)
Obstetric Discharge Summary Reason for Admission: onset of labor Prenatal Procedures: none Intrapartum Procedures: spontaneous vaginal delivery Postpartum Procedures: none Complications-Operative and Postpartum: episiotomy HEMOGLOBIN  Date Value Ref Range Status  05/18/2015 10.6* 12.0 - 15.0 g/dL Final   HCT  Date Value Ref Range Status  05/18/2015 31.9* 36.0 - 46.0 % Final    Physical Exam:  General: alert, cooperative and appears stated age Lochia: appropriate Uterine Fundus: firm Incision: healing well, no significant drainage, no dehiscence DVT Evaluation: No evidence of DVT seen on physical exam. Negative Homan's sign. No cords or calf tenderness.  Discharge Diagnoses: Term Pregnancy-delivered  Discharge Information: Date: 05/19/2015 Activity: pelvic rest Diet: routine Medications: PNV, Ibuprofen and Percocet Condition: stable Instructions: refer to practice specific booklet Discharge to: home   Newborn Data: Live born female  Birth Weight: 6 lb 3.3 oz (2815 g) APGAR: 8, 9  Home with mother.  Tara Watson 05/19/2015, 10:30 AM

## 2015-05-19 NOTE — Progress Notes (Signed)
Post Partum Day 2 Subjective: no complaints, up ad lib, voiding and tolerating PO  Objective: Blood pressure 104/77, pulse 74, temperature 98 F (36.7 C), temperature source Oral, resp. rate 20, height 5' (1.524 m), weight 164 lb (74.39 kg), last menstrual period 08/25/2014, SpO2 100 %, unknown if currently breastfeeding.  Physical Exam:  General: alert, cooperative and appears stated age Lochia: appropriate Uterine Fundus: firm Incision: healing well, no significant drainage, no dehiscence DVT Evaluation: No evidence of DVT seen on physical exam. Negative Homan's sign. No cords or calf tenderness.   Recent Labs  05/16/15 1325 05/18/15 0600  HGB 13.1 10.6*  HCT 38.1 31.9*    Assessment/Plan: Discharge home   LOS: 3 days   Tara Watson 05/19/2015, 10:27 AM

## 2015-05-19 NOTE — Discharge Instructions (Signed)
Call MD for T>100.4, heavy vaginal bleeding, severe abdominal pain, or respiratory distress.  Call office to schedule postpartum appointment in 4-6 weeks.  No driving while taking narcotics.  Pelvic rest x 6 weeks. °

## 2015-05-27 ENCOUNTER — Other Ambulatory Visit: Payer: Self-pay | Admitting: General Surgery

## 2015-06-04 ENCOUNTER — Encounter (HOSPITAL_COMMUNITY): Payer: Self-pay

## 2015-06-04 ENCOUNTER — Encounter (HOSPITAL_COMMUNITY)
Admission: RE | Admit: 2015-06-04 | Discharge: 2015-06-04 | Disposition: A | Discharge status: Home / Self Care | Payer: Medicaid Other | Source: Ambulatory Visit | Attending: General Surgery | Admitting: General Surgery

## 2015-06-04 DIAGNOSIS — K8080 Other cholelithiasis without obstruction: Secondary | ICD-10-CM | POA: Insufficient documentation

## 2015-06-04 DIAGNOSIS — Z01812 Encounter for preprocedural laboratory examination: Secondary | ICD-10-CM | POA: Insufficient documentation

## 2015-06-04 LAB — COMPREHENSIVE METABOLIC PANEL
ALK PHOS: 186 U/L — AB (ref 38–126)
ALT: 68 U/L — ABNORMAL HIGH (ref 14–54)
ANION GAP: 6 (ref 5–15)
AST: 45 U/L — ABNORMAL HIGH (ref 15–41)
Albumin: 3.4 g/dL — ABNORMAL LOW (ref 3.5–5.0)
BILIRUBIN TOTAL: 0.6 mg/dL (ref 0.3–1.2)
BUN: 8 mg/dL (ref 6–20)
CHLORIDE: 107 mmol/L (ref 101–111)
CO2: 26 mmol/L (ref 22–32)
CREATININE: 0.72 mg/dL (ref 0.44–1.00)
Calcium: 8.8 mg/dL — ABNORMAL LOW (ref 8.9–10.3)
GFR calc non Af Amer: >60 mL/min (ref >60)
GLUCOSE: 91 mg/dL (ref 65–99)
Potassium: 4 mmol/L (ref 3.5–5.1)
Sodium: 139 mmol/L (ref 135–145)
Total Protein: 6.5 g/dL (ref 6.5–8.1)

## 2015-06-04 LAB — CBC
HEMATOCRIT: 40.4 % (ref 36.0–46.0)
HEMOGLOBIN: 13 g/dL (ref 12.0–15.0)
MCH: 29.5 pg (ref 26.0–34.0)
MCHC: 32.2 g/dL (ref 30.0–36.0)
MCV: 91.8 fL (ref 78.0–100.0)
Platelets: 292 10*3/uL (ref 150–400)
RBC: 4.4 MIL/uL (ref 3.87–5.11)
RDW: 13.3 % (ref 11.5–15.5)
WBC: 5.3 10*3/uL (ref 4.0–10.5)

## 2015-06-04 LAB — HCG, SERUM, QUALITATIVE: PREG SERUM: NEGATIVE

## 2015-06-04 NOTE — Progress Notes (Addendum)
Patient denies CP or Shob. Reports that PCP is Dr. Lysbeth Galas at [facility] in [place]. Reports she had a echo in 2014 noted in Epic due to syncope. Patient reports that cardiology referred her for neurology consultation and that she has had no further issues since.

## 2015-06-04 NOTE — Pre-Procedure Instructions (Signed)
Tara Watson  06/04/2015     Your procedure is scheduled on June 24.  Report to [facility] Admitting at 5:30 A.M.  Call this number if you have problems the morning of surgery:  [phone]   Remember:  Do not eat food or drink liquids after midnight.  Take these medicines the morning of surgery with A SIP OF WATER None   STOP/ Do not take Aspirin, Aleve, Naproxen, Advil, Ibuprofen, Motrin, Vitamins, Herbs, or Supplements starting today   Do not wear jewelry, make-up or nail polish.  Do not wear lotions, powders, or perfumes.  You may wear deodorant.  Do not shave 48 hours prior to surgery.  Men may shave face and neck.  Do not bring valuables to the hospital.  [facility] is not responsible for any belongings or valuables.  Contacts, dentures or bridgework may not be worn into surgery.  Leave your suitcase in the car.  After surgery it may be brought to your room.  For patients admitted to the hospital, discharge time will be determined by your treatment team.  Patients discharged the day of surgery will not be allowed to drive home.   [place] - Preparing for Surgery  Before surgery, you can play an important role.  Because skin is not sterile, your skin needs to be as free of germs as possible.  You can reduce the number of germs on you skin by washing with CHG (chlorahexidine gluconate) soap before surgery.  CHG is an antiseptic cleaner which kills germs and bonds with the skin to continue killing germs even after washing.  Please DO NOT use if you have an allergy to CHG or antibacterial soaps.  If your skin becomes reddened/irritated stop using the CHG and inform your nurse when you arrive at Short Stay.  Do not shave (including legs and underarms) for at least 48 hours prior to the first CHG shower.  You may shave your face.  Please follow these instructions carefully:   1.  Shower with CHG Soap the night before surgery and the morning of Surgery.  2.   If you choose to wash your hair, wash your hair first as usual with your normal shampoo.  3.  After you shampoo, rinse your hair and body thoroughly to remove the shampoo.  4.  Use CHG as you would any other liquid soap.  You can apply CHG directly to the skin and wash gently with scrungie or a clean washcloth.  5.  Apply the CHG Soap to your body ONLY FROM THE NECK DOWN.  Do not use on open wounds or open sores.  Avoid contact with your eyes, ears, mouth and genitals (private parts).  Wash genitals (private parts) with your normal soap.  6.  Wash thoroughly, paying special attention to the area where your surgery will be performed.  7.  Thoroughly rinse your body with warm water from the neck down.  8.  DO NOT shower/wash with your normal soap after using and rinsing off the CHG Soap.  9.  Pat yourself dry with a clean towel.            10.  Wear clean pajamas.            11.  Place clean sheets on your bed the night of your first shower and do not sleep with pets.  Day of Surgery  Do not apply any lotions the morning of surgery.  Please wear clean clothes  to the hospital/surgery center.    Please read over the following fact sheets that you were given. Pain Booklet, Coughing and Deep Breathing and Surgical Site Infection Prevention

## 2015-06-07 ENCOUNTER — Encounter (HOSPITAL_COMMUNITY)
Admission: RE | Disposition: A | Discharge status: Home / Self Care | Payer: Self-pay | Source: Ambulatory Visit | Attending: General Surgery

## 2015-06-07 ENCOUNTER — Encounter (HOSPITAL_COMMUNITY): Payer: Self-pay | Admitting: Anesthesiology

## 2015-06-07 ENCOUNTER — Ambulatory Visit (HOSPITAL_COMMUNITY)
Admission: RE | Admit: 2015-06-07 | Discharge: 2015-06-07 | Disposition: A | Discharge status: Home / Self Care | Payer: Medicaid Other | Source: Ambulatory Visit | Attending: General Surgery | Admitting: General Surgery

## 2015-06-07 ENCOUNTER — Ambulatory Visit (HOSPITAL_COMMUNITY): Payer: Medicaid Other | Admitting: Anesthesiology

## 2015-06-07 ENCOUNTER — Ambulatory Visit (HOSPITAL_COMMUNITY): Payer: Medicaid Other | Admitting: Emergency Medicine

## 2015-06-07 ENCOUNTER — Ambulatory Visit (HOSPITAL_COMMUNITY): Payer: Medicaid Other

## 2015-06-07 DIAGNOSIS — Z885 Allergy status to narcotic agent status: Secondary | ICD-10-CM | POA: Diagnosis not present

## 2015-06-07 DIAGNOSIS — Z882 Allergy status to sulfonamides status: Secondary | ICD-10-CM | POA: Insufficient documentation

## 2015-06-07 DIAGNOSIS — Z881 Allergy status to other antibiotic agents status: Secondary | ICD-10-CM | POA: Diagnosis not present

## 2015-06-07 DIAGNOSIS — K8020 Calculus of gallbladder without cholecystitis without obstruction: Secondary | ICD-10-CM

## 2015-06-07 DIAGNOSIS — Z888 Allergy status to other drugs, medicaments and biological substances status: Secondary | ICD-10-CM | POA: Insufficient documentation

## 2015-06-07 DIAGNOSIS — K8010 Calculus of gallbladder with chronic cholecystitis without obstruction: Secondary | ICD-10-CM | POA: Insufficient documentation

## 2015-06-07 HISTORY — PX: CHOLECYSTECTOMY: SHX55

## 2015-06-07 SURGERY — LAPAROSCOPIC CHOLECYSTECTOMY WITH INTRAOPERATIVE CHOLANGIOGRAM
Anesthesia: General | Site: Abdomen

## 2015-06-07 MED ORDER — LACTATED RINGERS IV SOLN
INTRAVENOUS | Status: DC | PRN
  Administered 2015-06-07 (×2): via INTRAVENOUS

## 2015-06-07 MED ORDER — MIDAZOLAM HCL 2 MG/2ML IJ SOLN
INTRAMUSCULAR | Status: AC
  Filled 2015-06-07: qty 2

## 2015-06-07 MED ORDER — PROPOFOL 10 MG/ML IV BOLUS
INTRAVENOUS | Status: DC | PRN
  Administered 2015-06-07: 150 mg via INTRAVENOUS

## 2015-06-07 MED ORDER — HYDROMORPHONE HCL 1 MG/ML IJ SOLN
0.2500 mg | INTRAMUSCULAR | Status: DC | PRN
  Administered 2015-06-07: 0.5 mg via INTRAVENOUS

## 2015-06-07 MED ORDER — 0.9 % SODIUM CHLORIDE (POUR BTL) OPTIME
TOPICAL | Status: DC | PRN
  Administered 2015-06-07: 1000 mL

## 2015-06-07 MED ORDER — SODIUM CHLORIDE 0.9 % IR SOLN
Status: DC | PRN
  Administered 2015-06-07: 1000 mL

## 2015-06-07 MED ORDER — BUPIVACAINE-EPINEPHRINE (PF) 0.25% -1:200000 IJ SOLN
INTRAMUSCULAR | Status: AC
  Filled 2015-06-07: qty 30

## 2015-06-07 MED ORDER — ONDANSETRON HCL 4 MG/2ML IJ SOLN
INTRAMUSCULAR | Status: DC | PRN
  Administered 2015-06-07: 4 mg via INTRAVENOUS

## 2015-06-07 MED ORDER — CIPROFLOXACIN IN D5W 400 MG/200ML IV SOLN
400.0000 mg | INTRAVENOUS | Status: AC
  Administered 2015-06-07: 400 mg via INTRAVENOUS

## 2015-06-07 MED ORDER — SCOPOLAMINE 1 MG/3DAYS TD PT72
MEDICATED_PATCH | TRANSDERMAL | Status: AC
  Filled 2015-06-07: qty 1

## 2015-06-07 MED ORDER — SCOPOLAMINE 1 MG/3DAYS TD PT72
1.0000 | MEDICATED_PATCH | TRANSDERMAL | Status: DC
  Administered 2015-06-07: 1.5 mg via TRANSDERMAL
  Filled 2015-06-07: qty 1

## 2015-06-07 MED ORDER — CHLORHEXIDINE GLUCONATE 4 % EX LIQD: 1.0000 "application " | Freq: Once | CUTANEOUS | Status: DC

## 2015-06-07 MED ORDER — GLYCOPYRROLATE 0.2 MG/ML IJ SOLN
INTRAMUSCULAR | Status: DC | PRN
  Administered 2015-06-07: .7 mg via INTRAVENOUS

## 2015-06-07 MED ORDER — MIDAZOLAM HCL 5 MG/5ML IJ SOLN
INTRAMUSCULAR | Status: DC | PRN
  Administered 2015-06-07: 2 mg via INTRAVENOUS

## 2015-06-07 MED ORDER — NEOSTIGMINE METHYLSULFATE 10 MG/10ML IV SOLN
INTRAVENOUS | Status: DC | PRN
  Administered 2015-06-07: 4 mg via INTRAVENOUS

## 2015-06-07 MED ORDER — FENTANYL CITRATE (PF) 100 MCG/2ML IJ SOLN
INTRAMUSCULAR | Status: DC | PRN
  Administered 2015-06-07: 150 ug via INTRAVENOUS
  Administered 2015-06-07: 50 ug via INTRAVENOUS

## 2015-06-07 MED ORDER — KETOROLAC TROMETHAMINE 30 MG/ML IJ SOLN
30.0000 mg | Freq: Once | INTRAMUSCULAR | Status: AC | PRN
  Administered 2015-06-07: 30 mg via INTRAVENOUS

## 2015-06-07 MED ORDER — CIPROFLOXACIN IN D5W 400 MG/200ML IV SOLN
INTRAVENOUS | Status: AC
  Filled 2015-06-07: qty 200

## 2015-06-07 MED ORDER — HYDROMORPHONE HCL 1 MG/ML IJ SOLN
INTRAMUSCULAR | Status: AC
  Filled 2015-06-07: qty 1

## 2015-06-07 MED ORDER — PROPOFOL 10 MG/ML IV BOLUS
INTRAVENOUS | Status: AC
  Filled 2015-06-07: qty 20

## 2015-06-07 MED ORDER — KETOROLAC TROMETHAMINE 30 MG/ML IJ SOLN
INTRAMUSCULAR | Status: DC
Start: 2015-06-07 — End: 2015-06-07
  Filled 2015-06-07: qty 1

## 2015-06-07 MED ORDER — LACTATED RINGERS IV SOLN
INTRAVENOUS | Status: DC
  Administered 2015-06-07: 08:00:00 via INTRAVENOUS

## 2015-06-07 MED ORDER — BUPIVACAINE-EPINEPHRINE 0.25% -1:200000 IJ SOLN
INTRAMUSCULAR | Status: DC | PRN
  Administered 2015-06-07: 24 mL

## 2015-06-07 MED ORDER — OXYCODONE HCL 5 MG PO TABS
ORAL_TABLET | ORAL | Status: AC
  Filled 2015-06-07: qty 1

## 2015-06-07 MED ORDER — FENTANYL CITRATE (PF) 250 MCG/5ML IJ SOLN
INTRAMUSCULAR | Status: AC
  Filled 2015-06-07: qty 5

## 2015-06-07 MED ORDER — LIDOCAINE HCL (CARDIAC) 20 MG/ML IV SOLN
INTRAVENOUS | Status: DC | PRN
  Administered 2015-06-07: 50 mg via INTRAVENOUS

## 2015-06-07 MED ORDER — OXYCODONE HCL 5 MG/5ML PO SOLN: 5.0000 mg | Freq: Once | ORAL | Status: AC | PRN

## 2015-06-07 MED ORDER — PROMETHAZINE HCL 25 MG/ML IJ SOLN: 6.2500 mg | INTRAMUSCULAR | Status: DC | PRN

## 2015-06-07 MED ORDER — SODIUM CHLORIDE 0.9 % IV SOLN
INTRAVENOUS | Status: DC | PRN
  Administered 2015-06-07: 8 mL

## 2015-06-07 MED ORDER — OXYCODONE-ACETAMINOPHEN 5-325 MG PO TABS: 1.0000 | ORAL_TABLET | ORAL | Status: DC | PRN

## 2015-06-07 MED ORDER — OXYCODONE HCL 5 MG PO TABS
5.0000 mg | ORAL_TABLET | Freq: Once | ORAL | Status: AC | PRN
  Administered 2015-06-07: 5 mg via ORAL

## 2015-06-07 MED ORDER — ROCURONIUM BROMIDE 100 MG/10ML IV SOLN
INTRAVENOUS | Status: DC | PRN
  Administered 2015-06-07: 30 mg via INTRAVENOUS

## 2015-06-07 SURGICAL SUPPLY — 41 items
APPLIER CLIP 5 13 M/L LIGAMAX5 (MISCELLANEOUS) ×2
APR CLP MED LRG 5 ANG JAW (MISCELLANEOUS) ×1
BAG SPEC RTRVL LRG 6X4 10 (ENDOMECHANICALS) ×1
BLADE SURG ROTATE 9660 (MISCELLANEOUS) IMPLANT
CANISTER SUCTION 2500CC (MISCELLANEOUS) ×2 IMPLANT
CATH REDDICK CHOLANGI 4FR 50CM (CATHETERS) ×2 IMPLANT
CHLORAPREP W/TINT 26ML (MISCELLANEOUS) ×2 IMPLANT
CLIP APPLIE 5 13 M/L LIGAMAX5 (MISCELLANEOUS) ×1 IMPLANT
COVER MAYO STAND STRL (DRAPES) ×2 IMPLANT
COVER SURGICAL LIGHT HANDLE (MISCELLANEOUS) ×2 IMPLANT
DECANTER SPIKE VIAL GLASS SM (MISCELLANEOUS) ×1 IMPLANT
DRAPE C-ARM 42X72 X-RAY (DRAPES) ×2 IMPLANT
ELECT REM PT RETURN 9FT ADLT (ELECTROSURGICAL) ×2
ELECTRODE REM PT RTRN 9FT ADLT (ELECTROSURGICAL) ×1 IMPLANT
GLOVE BIO SURGEON STRL SZ 6.5 (GLOVE) ×5 IMPLANT
GLOVE BIO SURGEON STRL SZ7.5 (GLOVE) ×3 IMPLANT
GLOVE BIOGEL PI IND STRL 6.5 (GLOVE) IMPLANT
GLOVE BIOGEL PI IND STRL 7.5 (GLOVE) IMPLANT
GLOVE BIOGEL PI INDICATOR 6.5 (GLOVE) ×5
GLOVE BIOGEL PI INDICATOR 7.5 (GLOVE) ×1
GLOVE SURG SS PI 7.0 STRL IVOR (GLOVE) ×1 IMPLANT
GOWN STRL REUS W/ TWL LRG LVL3 (GOWN DISPOSABLE) ×3 IMPLANT
GOWN STRL REUS W/TWL LRG LVL3 (GOWN DISPOSABLE) ×14
IV CATH 14GX2 1/4 (CATHETERS) ×2 IMPLANT
KIT BASIN OR (CUSTOM PROCEDURE TRAY) ×2 IMPLANT
KIT ROOM TURNOVER OR (KITS) ×2 IMPLANT
LIQUID BAND (GAUZE/BANDAGES/DRESSINGS) ×2 IMPLANT
NS IRRIG 1000ML POUR BTL (IV SOLUTION) ×2 IMPLANT
PAD ARMBOARD 7.5X6 YLW CONV (MISCELLANEOUS) ×2 IMPLANT
POUCH SPECIMEN RETRIEVAL 10MM (ENDOMECHANICALS) ×2 IMPLANT
SCISSORS LAP 5X35 DISP (ENDOMECHANICALS) ×2 IMPLANT
SET IRRIG TUBING LAPAROSCOPIC (IRRIGATION / IRRIGATOR) ×2 IMPLANT
SLEEVE ENDOPATH XCEL 5M (ENDOMECHANICALS) ×4 IMPLANT
SPECIMEN JAR SMALL (MISCELLANEOUS) ×2 IMPLANT
SUT MNCRL AB 4-0 PS2 18 (SUTURE) ×2 IMPLANT
TOWEL OR 17X24 6PK STRL BLUE (TOWEL DISPOSABLE) ×1 IMPLANT
TOWEL OR 17X26 10 PK STRL BLUE (TOWEL DISPOSABLE) ×2 IMPLANT
TRAY LAPAROSCOPIC (CUSTOM PROCEDURE TRAY) ×2 IMPLANT
TROCAR XCEL BLUNT TIP 100MML (ENDOMECHANICALS) ×2 IMPLANT
TROCAR XCEL NON-BLD 5MMX100MML (ENDOMECHANICALS) ×2 IMPLANT
TUBING INSUFFLATION (TUBING) ×2 IMPLANT

## 2015-06-07 NOTE — Transfer of Care (Signed)
Immediate Anesthesia Transfer of Care Note  Patient: Tara Watson  Procedure(s) Performed: Procedure(s): LAPAROSCOPIC CHOLECYSTECTOMY WITH INTRAOPERATIVE CHOLANGIOGRAM (N/A)  Patient Location: PACU  Anesthesia Type:General  Level of Consciousness: awake, alert  and oriented  Airway & Oxygen Therapy: Patient Spontanous Breathing and Patient connected to nasal cannula oxygen  Post-op Assessment: Report given to RN and Post -op Vital signs reviewed and stable  Post vital signs: Reviewed and stable  Last Vitals:  Filed Vitals:   06/07/15 0726  BP: 103/68  Pulse: 77  Temp: 37.1 C  Resp: 20    Complications: No apparent anesthesia complications

## 2015-06-07 NOTE — Anesthesia Procedure Notes (Signed)
Procedure Name: Intubation Date/Time: 06/07/2015 10:14 AM Performed by: Eligha Bridegroom Pre-anesthesia Checklist: Emergency Drugs available, Patient identified, Timeout performed, Suction available and Patient being monitored Patient Re-evaluated:Patient Re-evaluated prior to inductionOxygen Delivery Method: Circle system utilized Preoxygenation: Pre-oxygenation with 100% oxygen Intubation Type: IV induction Ventilation: Mask ventilation without difficulty Laryngoscope Size: Mac and 3 Grade View: Grade I Tube type: Oral Tube size: 7.0 mm Number of attempts: 1 Airway Equipment and Method: Stylet and LTA kit utilized Placement Confirmation: ETT inserted through vocal cords under direct vision,  breath sounds checked- equal and bilateral and positive ETCO2 Secured at: 21 cm Tube secured with: Tape Dental Injury: Teeth and Oropharynx as per pre-operative assessment

## 2015-06-07 NOTE — Interval H&P Note (Signed)
History and Physical Interval Note:  06/07/2015 7:34 AM  Tara Watson  has presented today for surgery, with the diagnosis of Gallstones  The various methods of treatment have been discussed with the patient and family. After consideration of risks, benefits and other options for treatment, the patient has consented to  Procedure(s): LAPAROSCOPIC CHOLECYSTECTOMY WITH INTRAOPERATIVE CHOLANGIOGRAM (N/A) as a surgical intervention .  The patient's history has been reviewed, patient examined, no change in status, stable for surgery.  I have reviewed the patient's chart and labs.  Questions were answered to the patient's satisfaction.     TOTH III,PAUL S

## 2015-06-07 NOTE — Anesthesia Postprocedure Evaluation (Signed)
  Anesthesia Post-op Note  Patient: Tara Watson  Procedure(s) Performed: Procedure(s): LAPAROSCOPIC CHOLECYSTECTOMY WITH INTRAOPERATIVE CHOLANGIOGRAM (N/A)  Patient Location: PACU  Anesthesia Type: General   Level of Consciousness: awake, alert  and oriented  Airway and Oxygen Therapy: Patient Spontanous Breathing  Post-op Pain: mild  Post-op Assessment: Post-op Vital signs reviewed  Post-op Vital Signs: Reviewed  Last Vitals:  Filed Vitals:   06/07/15 1235  BP: 119/75  Pulse: 60  Temp:   Resp: 16    Complications: No apparent anesthesia complications

## 2015-06-07 NOTE — Op Note (Signed)
06/07/2015  11:16 AM  PATIENT:  Tara Watson  24 y.o. female  PRE-OPERATIVE DIAGNOSIS:  Gallstones  POST-OPERATIVE DIAGNOSIS:  Gallstones  PROCEDURE:  Procedure(s): LAPAROSCOPIC CHOLECYSTECTOMY WITH INTRAOPERATIVE CHOLANGIOGRAM (N/A)  SURGEON:  Surgeon(s) and Role:    * Griselda Miner, MD - Primary  PHYSICIAN ASSISTANT:   ASSISTANTS: Magnus Ivan, RNFA   ANESTHESIA:   general  EBL:  Total I/O In: 1000 [I.V.:1000] Out: -   BLOOD ADMINISTERED:none  DRAINS: none   LOCAL MEDICATIONS USED:  MARCAINE     SPECIMEN:  Source of Specimen:  gallbladder  DISPOSITION OF SPECIMEN:  PATHOLOGY  COUNTS:  YES  TOURNIQUET:  * No tourniquets in log *  DICTATION: .Dragon Dictation   Procedure: After informed consent was obtained the patient was brought to the operating room and placed in the supine position on the operating room table. After adequate induction of general anesthesia the patient's abdomen was prepped with ChloraPrep allowed to dry and draped in usual sterile manner. The area below the umbilicus was infiltrated with quarter percent  Marcaine. A small incision was made with a 15 blade knife. The incision was carried down through the subcutaneous tissue bluntly with a hemostat and [employer] retractors. The linea alba was identified. The linea alba was incised with a 15 blade knife and each side was grasped with Coker clamps. The preperitoneal space was then probed with a hemostat until the peritoneum was opened and access was gained to the abdominal cavity. A 0 Vicryl pursestring stitch was placed in the fascia surrounding the opening. A Hassan cannula was then placed through the opening and anchored in place with the previously placed Vicryl purse string stitch. The abdomen was insufflated with carbon dioxide without difficulty. A laparoscope was inserted through the [facility] cannula in the right upper quadrant was inspected. Next the epigastric region was infiltrated with %  Marcaine. A small incision was made with a 15 blade knife. A 5 mm port was placed bluntly through this incision into the abdominal cavity under direct vision. Next 2 sites were chosen laterally on the right side of the abdomen for placement of 5 mm ports. Each of these areas was infiltrated with quarter percent Marcaine. Small stab incisions were made with a 15 blade knife. 5 mm ports were then placed bluntly through these incisions into the abdominal cavity under direct vision without difficulty. A blunt grasper was placed through the lateralmost 5 mm port and used to grasp the dome of the gallbladder and elevated anteriorly and superiorly. Another blunt grasper was placed through the other 5 mm port and used to retract the body and neck of the gallbladder. A dissector was placed through the epigastric port and using the electrocautery the peritoneal reflection at the gallbladder neck was opened. Blunt dissection was then carried out in this area until the gallbladder neck-cystic duct junction was readily identified and a good window was created. A single clip was placed on the gallbladder neck. A small  ductotomy was made just below the clip with laparoscopic scissors. A 14-gauge Angiocath was then placed through the anterior abdominal wall under direct vision. A Reddick cholangiogram catheter was then placed through the Angiocath and flushed. The catheter was then placed in the cystic duct and anchored in place with a clip. A cholangiogram was obtained that showed no filling defects good emptying into the duodenum an adequate length on the cystic duct. The anchoring clip and catheters were then removed from the patient. 3 clips were placed  proximally on the cystic duct and the duct was divided between the 2 sets of clips. Posterior to this the cystic artery was identified and again dissected bluntly in a circumferential manner until a good window  was created. 2 clips were placed proximally and one distally on  the artery and the artery was divided between the 2 sets of clips. Next a laparoscopic hook cautery device was used to separate the gallbladder from the liver bed. Prior to completely detaching the gallbladder from the liver bed the liver bed was inspected and several small bleeding points were coagulated with the electrocautery until the area was completely hemostatic. The gallbladder was then detached the rest of it from the liver bed without difficulty. A laparoscopic bag was inserted through the hassan port. The gallbladder was placed within the bag and the bag was sealed. A laparoscope was moved to the epigastric port.  The bag with the gallbladder was then removed with the [facility] cannula through the infraumbilical port without difficulty. The fascial defect was then closed with the previously placed Vicryl pursestring stitch as well as with another figure-of-eight 0 Vicryl stitch. The liver bed was inspected again and found to be hemostatic. The abdomen was irrigated with copious amounts of saline until the effluent was clear. The ports were then removed under direct vision without difficulty and were found to be hemostatic. The gas was allowed to escape. The skin incisions were all closed with interrupted 4-0 Monocryl subcuticular stitches. Dermabond dressings were applied. The patient tolerated the procedure well. At the end of the case all needle sponge and instrument counts were correct. The patient was then awakened and taken to recovery in stable condition  PLAN OF CARE: Discharge to home after PACU  PATIENT DISPOSITION:  PACU - hemodynamically stable.   Delay start of Pharmacological VTE agent (>24hrs) due to surgical blood loss or risk of bleeding: not applicable

## 2015-06-07 NOTE — Anesthesia Preprocedure Evaluation (Addendum)
Anesthesia Evaluation  Patient identified by MRN, date of birth, ID band Patient awake    Reviewed: Allergy & Precautions, NPO status , Patient's Chart, lab work & pertinent test results  Airway Mallampati: I  TM Distance: >3 FB Neck ROM: Full    Dental   Pulmonary neg pulmonary ROS,  breath sounds clear to auscultation        Cardiovascular negative cardio ROS  Rhythm:Regular Rate:Normal     Neuro/Psych negative neurological ROS     GI/Hepatic negative GI ROS, Neg liver ROS,   Endo/Other  negative endocrine ROS  Renal/GU negative Renal ROS     Musculoskeletal   Abdominal   Peds  Hematology negative hematology ROS (+)   Anesthesia Other Findings   Reproductive/Obstetrics                            Anesthesia Physical Anesthesia Plan  ASA: I  Anesthesia Plan: General   Post-op Pain Management:    Induction: Intravenous  Airway Management Planned: Oral ETT  Additional Equipment:   Intra-op Plan:   Post-operative Plan: Extubation in OR  Informed Consent: I have reviewed the patients History and Physical, chart, labs and discussed the procedure including the risks, benefits and alternatives for the proposed anesthesia with the patient or authorized representative who has indicated his/her understanding and acceptance.   Dental advisory given  Plan Discussed with: CRNA  Anesthesia Plan Comments:        Anesthesia Quick Evaluation

## 2015-06-07 NOTE — H&P (Signed)
Tara Watson 01/02/2015 2:26 PM Location: [facility] Patient #: [id number] DOB: 12-Nov-1991 Single / Language: [place] / Race: White Female  History of Present Illness Tara Watson. Tara Edouard Watson; 01/02/2015 2:52 PM) Patient words: eval GB.  The patient is a 24 year old female who presents with abdominal pain. We are asked to see the patient in consultation by Dr. Marcelle Watson to evaluate her for gallstones. The patient is a 24 year old white female who has been experiencing upper abdominal pain. The pain tends to come and go. She has been experiencing this about once a week. She has not been gaining weight but her baby has been. She is [redacted] weeks pregnant. She states that pizza and [place] foods seem to cause her the worst pain. She has been able to keep a low fat diet down but she doesn't like it. She did undergo an ultrasound which showed stones in her gallbladder but no gallbladder wall thickening or ductal dilatation. Her liver functions were normal.   Other Problems Tara Watson, [place]; 01/02/2015 2:26 PM) Migraine Headache  Past Surgical History Tara Watson, [place]; 01/02/2015 2:26 PM) Tonsillectomy  Diagnostic Studies History Tara Watson, [place]; 01/02/2015 2:26 PM) Colonoscopy never Pap Smear 1-5 years ago  Allergies Tara Watson, [place]; 01/02/2015 2:28 PM) Ceftin *CEPHALOSPORINS* Codeine Phosphate *ANALGESICS - OPIOID* Erythromycin *MACROLIDES* Sulfa Antibiotics  Medication History Tara Marvel, MA; 01/02/2015 2:28 PM) Pepcid (20MG  Tablet, Oral daily) Active. Prenatal Plus/Iron (27-1MG  Tablet, Oral daily) Active.  Social History Tara Watson, [place]; 01/02/2015 2:26 PM) Caffeine use Carbonated beverages. No alcohol use No drug use Tobacco use Never smoker.  Family History Tara Watson, [place]; 01/02/2015 2:26 PM) First Degree Relatives No pertinent family history  Pregnancy / Birth History Tara Watson, [place]; 01/02/2015 2:26 PM) Age  at menarche 14 years. Contraceptive History Oral contraceptives. Gravida 0 Irregular periods Maternal age 11-25 Para 0  Review of Systems Tara Watson [place]; 01/02/2015 2:26 PM) General Present- Appetite Loss and Weight Loss. Not Present- Chills, Fatigue, Fever, Night Sweats and Weight Gain. Skin Not Present- Change in Wart/Mole, Dryness, Hives, Jaundice, New Lesions, Non-Healing Wounds, Rash and Ulcer. HEENT Present- Seasonal Allergies and Wears glasses/contact lenses. Not Present- Earache, Hearing Loss, Hoarseness, Nose Bleed, Oral Ulcers, Ringing in the Ears, Sinus Pain, Sore Throat, Visual Disturbances and Yellow Eyes. Respiratory Not Present- Bloody sputum, Chronic Cough, Difficulty Breathing, Snoring and Wheezing. Breast Not Present- Breast Mass, Breast Pain, Nipple Discharge and Skin Changes. Cardiovascular Not Present- Chest Pain, Difficulty Breathing Lying Down, Leg Cramps, Palpitations, Rapid Heart Rate, Shortness of Breath and Swelling of Extremities. Gastrointestinal Present- Abdominal Pain, Bloating, Indigestion, Nausea and Vomiting. Not Present- Bloody Stool, Change in Bowel Habits, Chronic diarrhea, Constipation, Difficulty Swallowing, Excessive gas, Gets full quickly at meals, Hemorrhoids and Rectal Pain. Female Genitourinary Not Present- Frequency, Nocturia, Painful Urination, Pelvic Pain and Urgency. Musculoskeletal Present- Back Pain. Not Present- Joint Pain, Joint Stiffness, Muscle Pain, Muscle Weakness and Swelling of Extremities. Neurological Present- Headaches. Not Present- Decreased Memory, Fainting, Numbness, Seizures, Tingling, Tremor, Trouble walking and Weakness. Psychiatric Not Present- Anxiety, Bipolar, Change in Sleep Pattern, Depression, Fearful and Frequent crying. Endocrine Not Present- Cold Intolerance, Excessive Hunger, Hair Changes, Heat Intolerance, Hot flashes and New Diabetes. Hematology Not Present- Easy Bruising, Excessive bleeding, Gland problems,  HIV and Persistent Infections.   Vitals Tara Watson [place]; 01/02/2015 2:27 PM) 01/02/2015 2:27 PM Weight: 143.4 lb Height: 60in Body Surface Area: 1.66 m Body Mass Index: 28.01 kg/m Temp.: 98.80F(Oral)  Pulse: 88 (Regular)  Resp.: 18 (  Unlabored)  BP: 106/62 (Sitting, Left Arm, Standard)    Physical Exam Tara Watson; 01/02/2015 2:54 PM) General Mental Status-Alert. General Appearance-Consistent with stated age. Hydration-Well hydrated. Voice-Normal.  Head and Neck Head-normocephalic, atraumatic with no lesions or palpable masses. Trachea-midline. Thyroid Gland Characteristics - normal size and consistency.  Eye Eyeball - Bilateral-Extraocular movements intact. Sclera/Conjunctiva - Bilateral-No scleral icterus.  Chest and Lung Exam Chest and lung exam reveals -quiet, even and easy respiratory effort with no use of accessory muscles and on auscultation, normal breath sounds, no adventitious sounds and normal vocal resonance. Inspection Chest Wall - Normal. Back - normal.  Cardiovascular Cardiovascular examination reveals -normal heart sounds, regular rate and rhythm with no murmurs and normal pedal pulses bilaterally.  Abdomen Note: The abdomen is soft and nontender. The uterus is palpable just at the level of the umbilicus   Neurologic Neurologic evaluation reveals -alert and oriented x 3 with no impairment of recent or remote memory. Mental Status-Normal.  Musculoskeletal Normal Exam - Left-Upper Extremity Strength Normal and Lower Extremity Strength Normal. Normal Exam - Right-Upper Extremity Strength Normal and Lower Extremity Strength Normal.  Lymphatic Head & Neck  General Head & Neck Lymphatics: Bilateral - Description - Normal. Axillary  General Axillary Region: Bilateral - Description - Normal. Tenderness - Non Tender. Femoral & Inguinal  Generalized Femoral & Inguinal Lymphatics: Bilateral - Description  - Normal. Tenderness - Non Tender.    Assessment & Plan Tara Watson S. Tara Watson; 01/02/2015 2:50 PM) GALLSTONES (574.20  K80.20) Impression: The patient appears to have symptomatic gallstones. She is also [redacted] weeks pregnant. Unfortunately she has entered the second trimester and this is not a good time from the baby's standpoint for an operation. She is able to low-fat foods without getting sick. I would encourage her to stick with a low-fat diet and get through the pregnancy and then we can plan to remove her gallbladder. I have discussed with her in detail the risks and benefits of the operation to remove the gallbladder as well as some of the technical aspects and she understands and wishes to proceed and she would like to plan for June 24 for her surgery     Signed by Caleen Essex, Watson (01/02/2015 2:54 PM)

## 2015-06-10 ENCOUNTER — Encounter (HOSPITAL_COMMUNITY): Payer: Self-pay | Admitting: General Surgery

## 2015-08-25 IMAGING — RF DG CHOLANGIOGRAM OPERATIVE
1 series · 6 of 6 positions shown · non-contrast
Comparison: 12/20/2014

CLINICAL DATA: Cholelithiasis, laparoscopic cholecystectomy

EXAM:
INTRAOPERATIVE CHOLANGIOGRAM
TECHNIQUE: Cholangiographic images from the C-arm fluoroscopic device were
submitted for interpretation post-operatively. Please see the
procedural report for the amount of contrast and the fluoroscopy
time utilized.

[Series 1: run · 3 acquisitions, 6 frames shown]
[im 1/3]
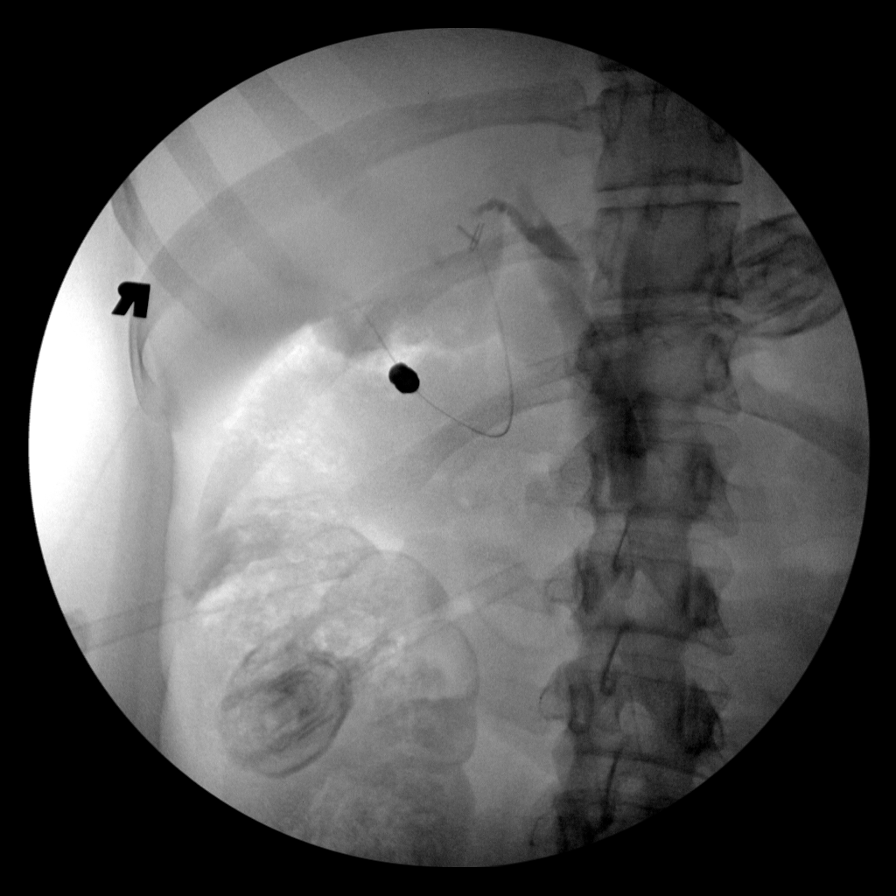
[im 1/3]
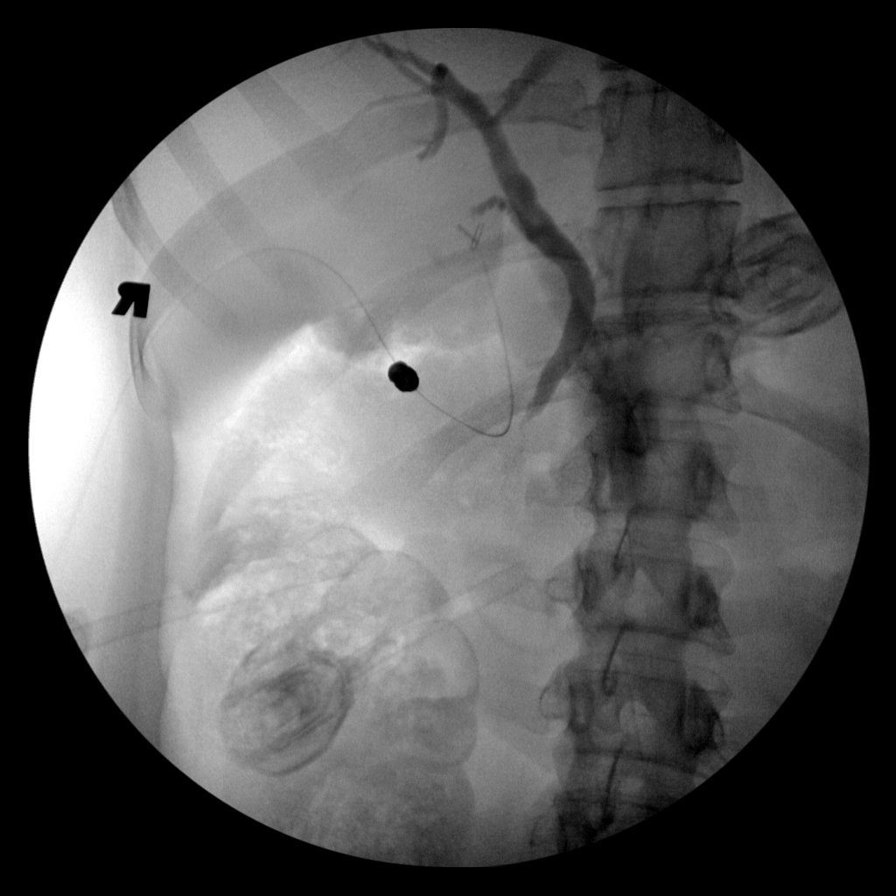
[im 1/3]
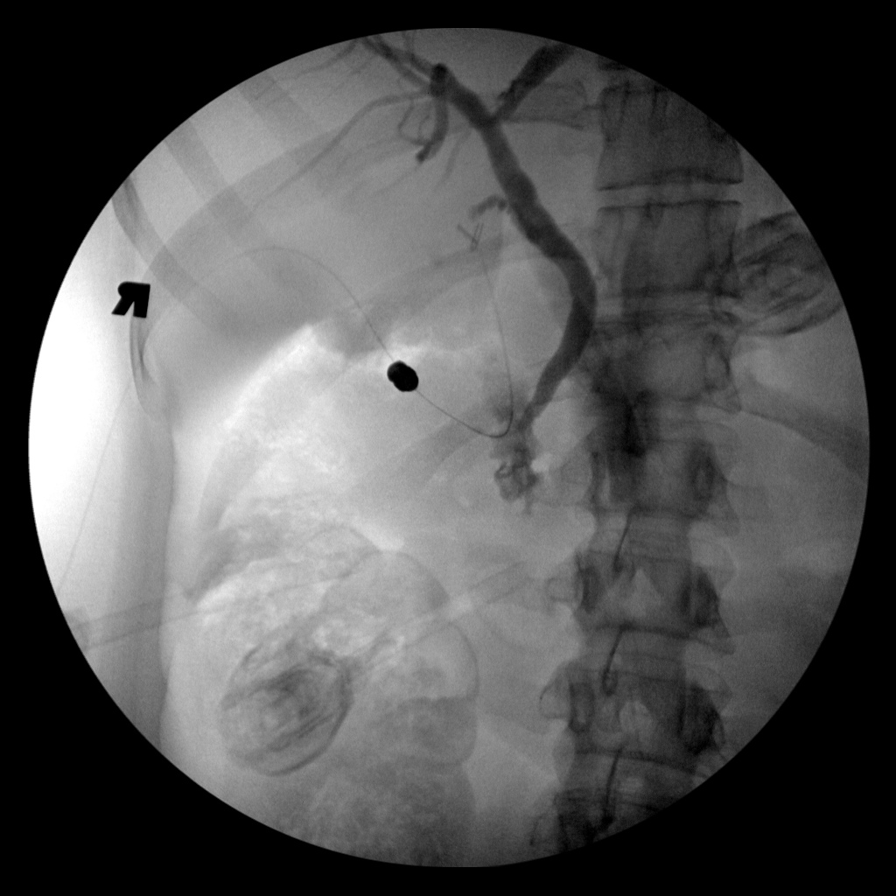
[im 1/3]
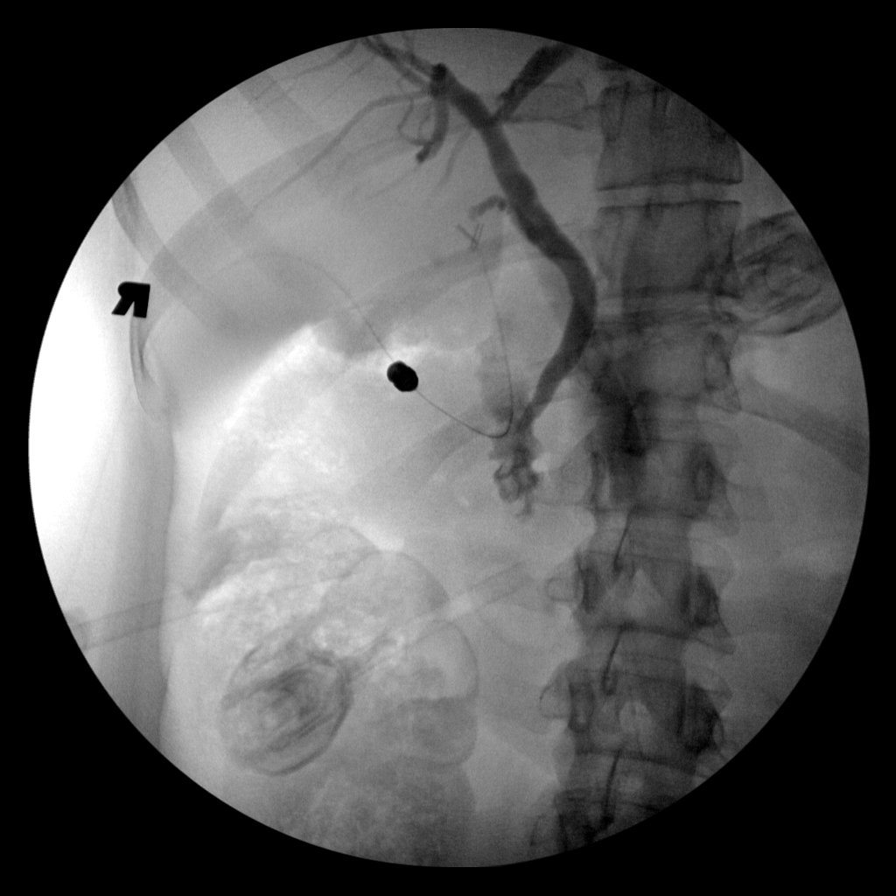
[im 2/3]
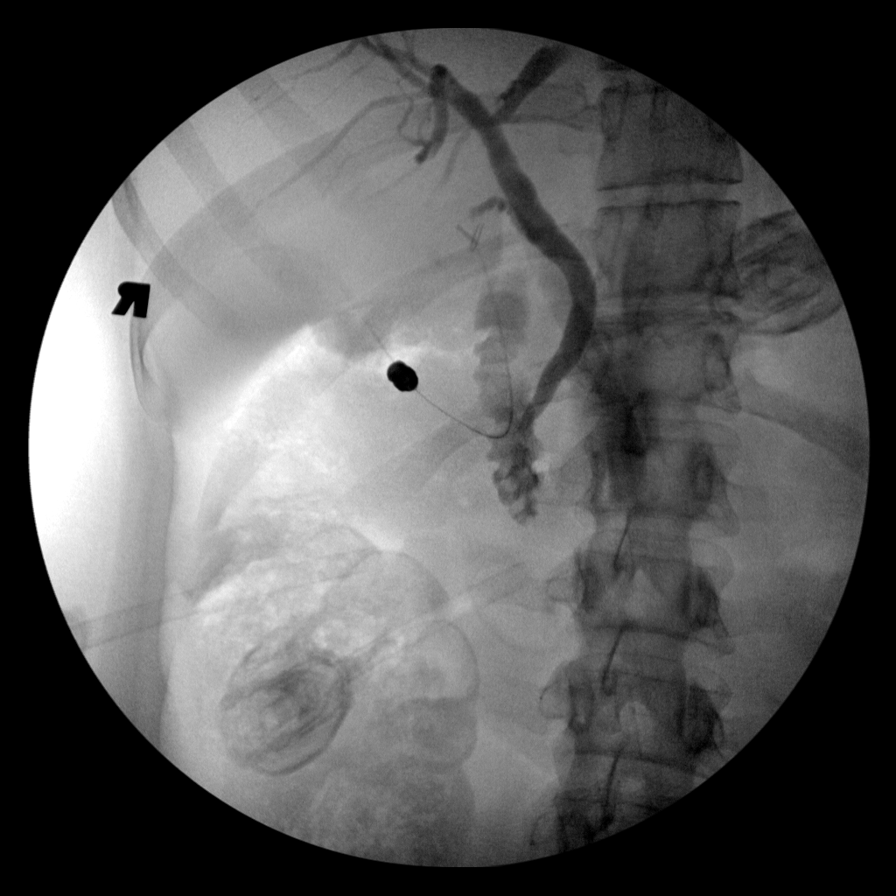
[im 3/3]
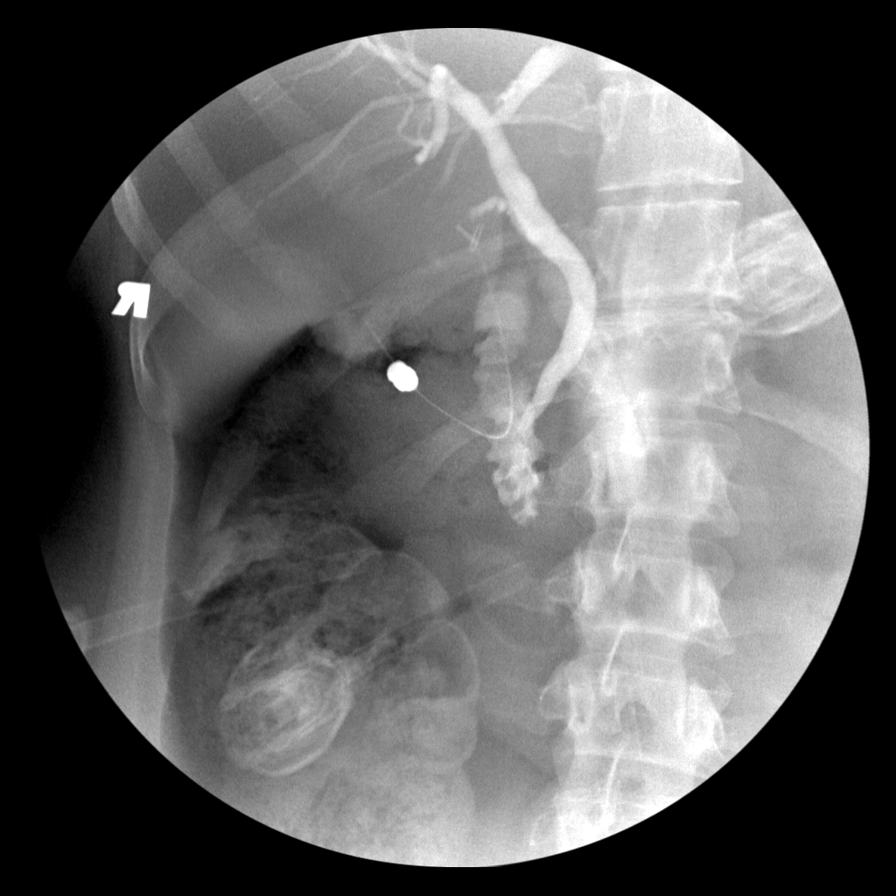

[6 of 6 positions shown; findings below may reference images not displayed]

FINDINGS: Intraoperative cholangiogram performed during the laparoscopic
cholecystectomy. The biliary confluence, common hepatic duct, cystic
duct, and common bile duct are patent. No dilatation or obstruction.
No filling defect. Contrast drains into the duodenum.
IMPRESSION: Patent biliary system.

## 2016-02-12 ENCOUNTER — Other Ambulatory Visit: Payer: Self-pay | Admitting: Obstetrics and Gynecology

## 2016-02-12 DIAGNOSIS — N644 Mastodynia: Secondary | ICD-10-CM

## 2016-02-14 ENCOUNTER — Other Ambulatory Visit: Payer: No Typology Code available for payment source

## 2016-02-18 ENCOUNTER — Ambulatory Visit
Admission: RE | Admit: 2016-02-18 | Discharge: 2016-02-18 | Disposition: A | Discharge status: Home / Self Care | Payer: Managed Care, Other (non HMO) | Source: Ambulatory Visit | Attending: Obstetrics and Gynecology | Admitting: Obstetrics and Gynecology

## 2016-02-18 DIAGNOSIS — N644 Mastodynia: Secondary | ICD-10-CM

## 2023-02-19 ENCOUNTER — Encounter (HOSPITAL_COMMUNITY): Payer: Self-pay | Admitting: Obstetrics and Gynecology

## 2023-02-19 ENCOUNTER — Inpatient Hospital Stay (HOSPITAL_COMMUNITY)
Admission: AD | Admit: 2023-02-19 | Discharge: 2023-02-19 | Disposition: A | Discharge status: Home / Self Care | Payer: 59 | Attending: Obstetrics and Gynecology | Admitting: Obstetrics and Gynecology

## 2023-02-19 ENCOUNTER — Other Ambulatory Visit: Payer: Self-pay

## 2023-02-19 DIAGNOSIS — R109 Unspecified abdominal pain: Secondary | ICD-10-CM | POA: Diagnosis not present

## 2023-02-19 DIAGNOSIS — O209 Hemorrhage in early pregnancy, unspecified: Secondary | ICD-10-CM | POA: Diagnosis not present

## 2023-02-19 DIAGNOSIS — Z3A15 15 weeks gestation of pregnancy: Secondary | ICD-10-CM | POA: Diagnosis not present

## 2023-02-19 DIAGNOSIS — O26892 Other specified pregnancy related conditions, second trimester: Secondary | ICD-10-CM | POA: Insufficient documentation

## 2023-02-19 DIAGNOSIS — N888 Other specified noninflammatory disorders of cervix uteri: Secondary | ICD-10-CM

## 2023-02-19 LAB — POCT PREGNANCY, URINE: Preg Test, Ur: POSITIVE — AB

## 2023-02-19 NOTE — MAU Provider Note (Signed)
History     CSN: [id number]  Arrival date and time: 02/19/23 1840   Event Date/Time   First Provider Initiated Contact with Patient 02/19/23 1940      Chief Complaint  Patient presents with   Vaginal Bleeding   Abdominal Pain   HPI  Tara Watson is a 32 y.o. G2P1001 at 58w5dwho presents for evaluation of spotting. 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 She reports she saw bleeding tonight. She reports it is when she wipes and a mix of bright and dark blood. She denies any pain.  She denies any discharge and leaking of fluid. Denies any constipation, diarrhea or any urinary complaints.   OB History     Gravida  2   Para  1   Term  1   Preterm  0   AB  0   Living  1      SAB  0   IAB  0   Ectopic  0   Multiple  0   Live Births  1           Past Medical History:  Diagnosis Date   Gallstones    Migraines     Past Surgical History:  Procedure Laterality Date   CHOLECYSTECTOMY N/A 06/07/2015   Procedure: LAPAROSCOPIC CHOLECYSTECTOMY WITH INTRAOPERATIVE CHOLANGIOGRAM;  Surgeon: PAutumn MessingIII, MD;  [facility]  Service: General;  Laterality: N/A;   TONSILLECTOMY     TYMPANOSTOMY TUBE PLACEMENT      History reviewed. No pertinent family history.  Social History   Tobacco Use   Smoking status: Never   Smokeless tobacco: Never  Substance Use Topics   Alcohol use: No   Drug use: No    Allergies:  Allergies  Allergen Reactions   Imitrex [Sumatriptan] Anaphylaxis   Ceftin [Cefuroxime] Hives   Codeine Hives   Erythromycin Hives   Sulfa Antibiotics Other (See Comments)    Gland swelling     Medications Prior to Admission  Medication Sig Dispense Refill Last Dose   Prenatal Vit-Fe Fumarate-FA (PRENATAL MULTIVITAMIN) TABS tablet Take 1 tablet by mouth daily at 12 noon.   02/18/2023   oxyCODONE-acetaminophen (ROXICET) 5-325 MG per tablet Take 1-2 tablets by mouth every 4  (four) hours as needed. 50 tablet 0 More than a month    Review of Systems  Constitutional: Negative.  Negative for fatigue and fever.  HENT: Negative.    Respiratory: Negative.  Negative for shortness of breath.   Cardiovascular: Negative.  Negative for chest pain.  Gastrointestinal: Negative.  Negative for abdominal pain, constipation, diarrhea, nausea and vomiting.  Genitourinary:  Positive for vaginal bleeding. Negative for dysuria.  Neurological: Negative.  Negative for dizziness and headaches.   Physical Exam   Blood pressure 110/75, pulse 78, temperature 98.3 F (36.8 C), temperature source Oral, resp. rate 20, height '4\' 11"'$  (1.499 m), weight 87.7 kg, SpO2 100 %.  Patient Vitals for the past 24 hrs:  BP Temp Temp src Pulse Resp SpO2 Height Weight  02/19/23 1858 110/75 98.3 F (36.8 C) Oral 78 20 100 % -- --  02/19/23 1853 -- -- -- -- -- -- '4\' 11"'$  (1.499 m) 87.7 kg    Physical Exam Vitals and nursing note reviewed.  Constitutional:      General: She is not in acute distress.    Appearance: She is well-developed.  HENT:  Head: Normocephalic.  Eyes:     Pupils: Pupils are equal, round, and reactive to light.  Cardiovascular:     Rate and Rhythm: Normal rate and regular rhythm.     Heart sounds: Normal heart sounds.  Pulmonary:     Effort: Pulmonary effort is normal. No respiratory distress.     Breath sounds: Normal breath sounds.  Abdominal:     General: Bowel sounds are normal. There is no distension.     Palpations: Abdomen is soft.     Tenderness: There is no abdominal tenderness.  Genitourinary:       Comments: 0.5cm area of friability on cervix. No active bleeding noted, cervix visually and digitally closed Skin:    General: Skin is warm and dry.  Neurological:     Mental Status: She is alert and oriented to person, place, and time.  Psychiatric:        Mood and Affect: Mood normal.        Behavior: Behavior normal.        Thought Content: Thought  content normal.        Judgment: Judgment normal.     FHT: 153 bpm   MAU Course  Procedures  Results for orders placed or performed during the hospital encounter of 02/19/23 (from the past 24 hour(s))  Pregnancy, urine POC     Status: Abnormal   Collection Time: 02/19/23  6:51 PM  Result Value Ref Range   Preg Test, Ur POSITIVE (A) NEGATIVE     MDM Labs ordered and reviewed.   CNM discussed friable cervix and pelvic rest precautions when bleeding.   Assessment and Plan   1. Friable cervix   2. [redacted] weeks gestation of pregnancy     -Discharge home in stable condition -Vaginal bleeding precautions discussed -Patient advised to follow-up with OB as scheduled for prenatal care -Patient may return to MAU as needed or if her condition were to change or worsen  Wende Mott, CNM 02/19/2023, 7:40 PM

## 2023-02-19 NOTE — Discharge Instructions (Signed)

## 2023-02-19 NOTE — MAU Note (Addendum)
Tara Watson is a 32 y.o. at Unknown here in MAU reporting: abdominal cramping on the left side and VB that began today @ approximately 1700.  Reports VB was with wiping, noted 2 quarter size clots.  Denies recent intercourse. LMP: NA Onset of complaint: today Pain score: 3 Vitals:   02/19/23 1858  BP: 110/75  Pulse: 78  Resp: 20  Temp: 98.3 F (36.8 C)  SpO2: 100%     FHT:143 bpm Lab orders placed from triage:   UA

## 2023-04-14 ENCOUNTER — Inpatient Hospital Stay (HOSPITAL_COMMUNITY): Payer: 59

## 2023-04-14 ENCOUNTER — Inpatient Hospital Stay (HOSPITAL_COMMUNITY)
Admission: AD | Admit: 2023-04-14 | Discharge: 2023-04-14 | Disposition: A | Discharge status: Home / Self Care | Payer: 59 | Attending: Obstetrics & Gynecology | Admitting: Obstetrics & Gynecology

## 2023-04-14 ENCOUNTER — Encounter (HOSPITAL_COMMUNITY): Payer: Self-pay | Admitting: Obstetrics & Gynecology

## 2023-04-14 DIAGNOSIS — Z881 Allergy status to other antibiotic agents status: Secondary | ICD-10-CM | POA: Insufficient documentation

## 2023-04-14 DIAGNOSIS — H538 Other visual disturbances: Secondary | ICD-10-CM

## 2023-04-14 DIAGNOSIS — Z3A23 23 weeks gestation of pregnancy: Secondary | ICD-10-CM

## 2023-04-14 DIAGNOSIS — R519 Headache, unspecified: Secondary | ICD-10-CM | POA: Insufficient documentation

## 2023-04-14 DIAGNOSIS — O26892 Other specified pregnancy related conditions, second trimester: Secondary | ICD-10-CM

## 2023-04-14 DIAGNOSIS — R110 Nausea: Secondary | ICD-10-CM | POA: Insufficient documentation

## 2023-04-14 DIAGNOSIS — Z882 Allergy status to sulfonamides status: Secondary | ICD-10-CM | POA: Insufficient documentation

## 2023-04-14 DIAGNOSIS — O219 Vomiting of pregnancy, unspecified: Secondary | ICD-10-CM

## 2023-04-14 LAB — URINALYSIS, ROUTINE W REFLEX MICROSCOPIC
Bilirubin Urine: NEGATIVE
Glucose, UA: NEGATIVE mg/dL
Hgb urine dipstick: NEGATIVE
Ketones, ur: NEGATIVE mg/dL
Nitrite: NEGATIVE
Protein, ur: NEGATIVE mg/dL
Specific Gravity, Urine: 1.004 — ABNORMAL LOW (ref 1.005–1.030)
pH: 7 (ref 5.0–8.0)

## 2023-04-14 LAB — BASIC METABOLIC PANEL
Anion gap: 9 (ref 5–15)
BUN: <5 mg/dL — ABNORMAL LOW (ref 6–20)
CO2: 23 mmol/L (ref 22–32)
Calcium: 8.6 mg/dL — ABNORMAL LOW (ref 8.9–10.3)
Chloride: 103 mmol/L (ref 98–111)
Creatinine, Ser: 0.55 mg/dL (ref 0.44–1.00)
GFR, Estimated: >60 mL/min (ref >60)
Glucose, Bld: 82 mg/dL (ref 70–99)
Potassium: 3.4 mmol/L — ABNORMAL LOW (ref 3.5–5.1)
Sodium: 135 mmol/L (ref 135–145)

## 2023-04-14 MED ORDER — ONDANSETRON HCL 4 MG/2ML IJ SOLN
4.0000 mg | Freq: Once | INTRAMUSCULAR | Status: AC
Start: 2023-04-14 — End: 2023-04-14
  Administered 2023-04-14: 4 mg via INTRAVENOUS
  Filled 2023-04-14: qty 2

## 2023-04-14 MED ORDER — LACTATED RINGERS IV BOLUS
1000.0000 mL | Freq: Once | INTRAVENOUS | Status: AC
  Administered 2023-04-14: 1000 mL via INTRAVENOUS

## 2023-04-14 NOTE — MAU Provider Note (Signed)
History     [id number]  Arrival date and time: 04/14/23 1233    Chief Complaint  Patient presents with   Headache   Light headed   Nausea     HPI Tara Watson is a 32 y.o. at 103w3d, who presents for headache.   Review of outside prenatal records from [facility] (in media tab): none sent yet in this pregnancy  Patient reports she has had a persistent headache for the past two days It is associated with blurry vision, so much so that she had to pull over several times while driving over the past few days She reports that today she has also developed a persistent nausea as of this morning In addition she reports feeling dizzy Denies any problems with walking Feels fatigued in general but no localized weakness in her body No numbness or tingling anywhere Reports she has a history of migraines but this headache is feeling and acting differently She works as a Production designer, 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, she reports her sleep is "average" but she does get calls in the middle of the night so perhaps not as good of sleep lately No recent travel No head trauma She has taken tylenol for her symptoms which sometimes helps with the headache but sometimes does not Has been trying to drink as much water as possible  Denies any vaginal bleeding or leaking fluid No contractions Fetal movement is normal      OB History     Gravida  3   Para  1   Term  1   Preterm  0   AB  1   Living  1      SAB  1   IAB  0   Ectopic  0   Multiple  0   Live Births  1           Past Medical History:  Diagnosis Date   Gallstones    Migraines     Past Surgical History:  Procedure Laterality Date   CHOLECYSTECTOMY N/A 06/07/2015   Procedure: LAPAROSCOPIC CHOLECYSTECTOMY WITH INTRAOPERATIVE CHOLANGIOGRAM;  Surgeon: Chevis Pretty III, MD;  Location: [facility];  Service: General;  Laterality: N/A;   TONSILLECTOMY     TYMPANOSTOMY TUBE PLACEMENT       No family history on file.  Social History   Socioeconomic History   Marital status: Single    Spouse name: Not on file   Number of children: Not on file   Years of education: Not on file   Highest education level: Not on file  Occupational History   Not on file  Tobacco Use   Smoking status: Never   Smokeless tobacco: Never  Vaping Use   Vaping Use: Never used  Substance and Sexual Activity   Alcohol use: No   Drug use: No   Sexual activity: Yes    Birth control/protection: None  Other Topics Concern   Not on file  Social History Narrative   Not on file   Social Determinants of Health   Financial Resource Strain: Not on file  Food Insecurity: Not on file  Transportation Needs: Not on file  Physical Activity: Not on file  Stress: Not on file  Social Connections: Not on file  Intimate Partner Violence: Not on file    Allergies  Allergen Reactions   Imitrex [Sumatriptan] Anaphylaxis   Ceftin [Cefuroxime] Hives   Codeine Hives   Erythromycin Hives  Sulfa Antibiotics Other (See Comments)    Gland swelling     No current facility-administered medications on file prior to encounter.   Current Outpatient Medications on File Prior to Encounter  Medication Sig Dispense Refill   acetaminophen (TYLENOL) 325 MG tablet Take 1,000 mg by mouth every 6 (six) hours as needed.     Prenatal Vit-Fe Fumarate-FA (PRENATAL MULTIVITAMIN) TABS tablet Take 1 tablet by mouth daily at 12 noon.       ROS Pertinent positives and negative per HPI, all others reviewed and negative  Physical Exam   BP 110/72 (BP Location: Right Arm)   Pulse 82   Temp 98 F (36.7 C) (Oral)   Resp 14   Ht 4\' 11"  (1.499 m)   Wt 86.5 kg   SpO2 100%   BMI 38.50 kg/m   Patient Vitals for the past 24 hrs:  BP Temp Temp src Pulse Resp SpO2 Height Weight  04/14/23 1253 110/72 98 F (36.7 C) Oral 82 14 100 % 4\' 11"  (1.499 m) 86.5 kg    Physical Exam Constitutional:      General: She is  not in acute distress.    Appearance: She is well-developed. She is not ill-appearing, toxic-appearing or diaphoretic.  HENT:     Head: Normocephalic and atraumatic.     Mouth/Throat:     Mouth: Mucous membranes are moist.  Eyes:     General: No visual field deficit or scleral icterus.    Extraocular Movements: Extraocular movements intact.     Right eye: Normal extraocular motion.     Left eye: Normal extraocular motion.     Pupils: Pupils are equal, round, and reactive to light. Pupils are equal.  Pulmonary:     Effort: Pulmonary effort is normal. No respiratory distress.  Abdominal:     Comments: gravid  Musculoskeletal:     Cervical back: Normal range of motion and neck supple.  Skin:    General: Skin is warm and dry.  Neurological:     Mental Status: She is alert and oriented to person, place, and time.     Cranial Nerves: No cranial nerve deficit or dysarthria.     Motor: No weakness.     Coordination: Coordination normal.     Deep Tendon Reflexes: Reflexes normal.     Comments: Neg pronator drift. Strength 5/5 in UE/LE bilaterally  Psychiatric:        Mood and Affect: Mood normal.        Behavior: Behavior normal.      Cervical Exam    Bedside Ultrasound Not performed.  My interpretation: n/a  FHT Baseline: 130 bpm Variability: Good {> 6 bpm) Accelerations: Reactive Decelerations: Absent Uterine activity: None Cat: I  Labs Results for orders placed or performed during the hospital encounter of 04/14/23 (from the past 24 hour(s))  Urinalysis, Routine w reflex microscopic -Urine, Clean Catch     Status: Abnormal   Collection Time: 04/14/23 12:59 PM  Result Value Ref Range   Color, Urine YELLOW YELLOW   APPearance HAZY (A) CLEAR   Specific Gravity, Urine 1.004 (L) 1.005 - 1.030   pH 7.0 5.0 - 8.0   Glucose, UA NEGATIVE NEGATIVE mg/dL   Hgb urine dipstick NEGATIVE NEGATIVE   Bilirubin Urine NEGATIVE NEGATIVE   Ketones, ur NEGATIVE NEGATIVE mg/dL    Protein, ur NEGATIVE NEGATIVE mg/dL   Nitrite NEGATIVE NEGATIVE   Leukocytes,Ua LARGE (A) NEGATIVE   RBC / HPF 0-5 0 - 5 RBC/hpf  WBC, UA 11-20 0 - 5 WBC/hpf   Bacteria, UA RARE (A) NONE SEEN   Squamous Epithelial / HPF 11-20 0 - 5 /HPF    Imaging MR BRAIN WO CONTRAST  Result Date: 04/14/2023 CLINICAL DATA:  Pregnant patient with new or worsening headache. Blurred vision. Nausea. Second trimester presently. EXAM: MRI HEAD WITHOUT CONTRAST TECHNIQUE: Multiplanar, multiecho pulse sequences of the brain and surrounding structures were obtained without intravenous contrast. COMPARISON:  10/27/2013 FINDINGS: Brain: The brain has a normal appearance without evidence of malformation, atrophy, old or acute small or large vessel infarction, mass lesion, hemorrhage, hydrocephalus or extra-axial collection. Vascular: Major vessels at the base of the brain show flow. Venous sinuses appear patent. Skull and upper cervical spine: Normal. Sinuses/Orbits: Clear/normal. Other: None significant. IMPRESSION: Normal examination. No abnormality seen to explain the presenting symptoms. Electronically Signed   By: Paulina Fusi M.D.   On: 04/14/2023 15:38    MAU Course  Procedures Lab Orders         Urinalysis, Routine w reflex microscopic -Urine, Clean Catch         Basic metabolic panel    Meds ordered this encounter  Medications   lactated ringers bolus 1,000 mL   ondansetron (ZOFRAN) injection 4 mg   Imaging Orders         MR BRAIN WO CONTRAST      MDM Moderate (Level 3-4)  Assessment and Plan  #Headache in pregnancy, second trimester #Blurry vision #Nausea in pregnancy #[redacted] weeks gestation of pregnancy Patient presenting with persistent headache that is different from prior migraines, blurry vision such that she reports needing to pull over while driving, and now new onset of nausea. Suspect combination of dehydration, advancing gestational age, and poor sleep as primary contributing factors to her  symptoms. That being said constellation of symptoms are worrisome and warrant further workup. Will obtain orthostatics, given 1L LR, zofran, and obtain MR brain to rule out intracranial pathology.   1:52 PM  Patient's MRI has returned without any acute abnormality. Orthostatic vitals are normal. Patient feels about the same. We discussed that there does not appear to be any acute or life threatening cause identified for her symptoms. At this point suspect that her symptoms are due to combination of factors listed above. Can consider meclizine for her dizziness/light headedness.   4:10 PM   #FWB FHT Cat I NST: Reactive   Dispo: discharged to home in stable condition    Venora Maples, MD/MPH 04/14/23 4:10 PM  Allergies as of 04/14/2023       Reactions   Imitrex [sumatriptan] Anaphylaxis   Ceftin [cefuroxime] Hives   Codeine Hives   Erythromycin Hives   Sulfa Antibiotics Other (See Comments)   Gland swelling        Medication List     TAKE these medications    acetaminophen 325 MG tablet Commonly known as: TYLENOL Take 1,000 mg by mouth every 6 (six) hours as needed.   prenatal multivitamin Tabs tablet Take 1 tablet by mouth daily at 12 noon.

## 2023-04-14 NOTE — MAU Note (Signed)
...  Tara Watson is a 32 y.o. at [redacted]w[redacted]d here in MAU reporting: Ongoing HA's, light headedness, visual changes, and fatigue. She reports her headaches have been anterior and right sided and have been intermittently relieved by Tylenol. Regarding her vision she reports it has been hard to focus. She reports her HR has been elevated today and was 132-140's earlier. She is also endorsing nausea today. Denies VB or LOF. DFM today.  Hx of migraines. She reports she hasn't suffered from them since prior to her first child.  Last meds:  Tylenol 1000 mg of Tylenol around 0730.  Onset of complaint: This past Saturday Pain score: 4/10 HA   FHT: 147 doppler Lab orders placed from triage:  UA

## 2023-06-23 ENCOUNTER — Encounter (HOSPITAL_COMMUNITY): Payer: Self-pay | Admitting: Obstetrics & Gynecology

## 2023-06-23 ENCOUNTER — Inpatient Hospital Stay (HOSPITAL_BASED_OUTPATIENT_CLINIC_OR_DEPARTMENT_OTHER): Payer: 59

## 2023-06-23 ENCOUNTER — Other Ambulatory Visit: Payer: Self-pay

## 2023-06-23 ENCOUNTER — Inpatient Hospital Stay (HOSPITAL_COMMUNITY)
Admission: AD | Admit: 2023-06-23 | Discharge: 2023-06-26 | DRG: 833 | Disposition: A | Discharge status: Home / Self Care | Payer: 59 | Attending: Obstetrics & Gynecology | Admitting: Obstetrics & Gynecology

## 2023-06-23 DIAGNOSIS — O368130 Decreased fetal movements, third trimester, not applicable or unspecified: Secondary | ICD-10-CM | POA: Diagnosis present

## 2023-06-23 DIAGNOSIS — O368330 Maternal care for abnormalities of the fetal heart rate or rhythm, third trimester, not applicable or unspecified: Secondary | ICD-10-CM | POA: Diagnosis present

## 2023-06-23 DIAGNOSIS — Z3A33 33 weeks gestation of pregnancy: Secondary | ICD-10-CM

## 2023-06-23 DIAGNOSIS — O4700 False labor before 37 completed weeks of gestation, unspecified trimester: Secondary | ICD-10-CM

## 2023-06-23 DIAGNOSIS — O4703 False labor before 37 completed weeks of gestation, third trimester: Secondary | ICD-10-CM | POA: Diagnosis present

## 2023-06-23 DIAGNOSIS — O321XX0 Maternal care for breech presentation, not applicable or unspecified: Secondary | ICD-10-CM | POA: Diagnosis present

## 2023-06-23 LAB — URINALYSIS, ROUTINE W REFLEX MICROSCOPIC
Bilirubin Urine: NEGATIVE
Glucose, UA: NEGATIVE mg/dL
Hgb urine dipstick: NEGATIVE
Ketones, ur: 5 mg/dL — AB
Leukocytes,Ua: NEGATIVE
Nitrite: NEGATIVE
Protein, ur: NEGATIVE mg/dL
Specific Gravity, Urine: 1.016 (ref 1.005–1.030)
pH: 6 (ref 5.0–8.0)

## 2023-06-23 LAB — TYPE AND SCREEN
ABO/RH(D): O POS
Antibody Screen: NEGATIVE

## 2023-06-23 MED ORDER — LACTATED RINGERS IV SOLN: 125.0000 mL/h | INTRAVENOUS | Status: AC

## 2023-06-23 MED ORDER — PENICILLIN G POT IN DEXTROSE 60000 UNIT/ML IV SOLN
3.0000 10*6.[IU] | INTRAVENOUS | Status: DC
  Filled 2023-06-23 (×4): qty 50

## 2023-06-23 MED ORDER — PENICILLIN G POT IN DEXTROSE 60000 UNIT/ML IV SOLN
3.0000 10*6.[IU] | INTRAVENOUS | Status: DC
  Administered 2023-06-23 – 2023-06-24 (×3): 3 10*6.[IU] via INTRAVENOUS
  Filled 2023-06-23 (×5): qty 50

## 2023-06-23 MED ORDER — CALCIUM CARBONATE ANTACID 500 MG PO CHEW: 2.0000 | CHEWABLE_TABLET | ORAL | Status: DC | PRN

## 2023-06-23 MED ORDER — LACTATED RINGERS IV BOLUS
1000.0000 mL | Freq: Once | INTRAVENOUS | Status: AC
  Administered 2023-06-23: 1000 mL via INTRAVENOUS

## 2023-06-23 MED ORDER — PRENATAL MULTIVITAMIN CH
1.0000 | ORAL_TABLET | Freq: Every day | ORAL | Status: DC
  Administered 2023-06-23 – 2023-06-25 (×3): 1 via ORAL
  Filled 2023-06-23 (×3): qty 1

## 2023-06-23 MED ORDER — LACTATED RINGERS IV SOLN: INTRAVENOUS | Status: DC

## 2023-06-23 MED ORDER — MAGNESIUM SULFATE BOLUS VIA INFUSION
4.0000 g | Freq: Once | INTRAVENOUS | Status: AC
  Administered 2023-06-23: 4 g via INTRAVENOUS
  Filled 2023-06-23: qty 1000

## 2023-06-23 MED ORDER — ACETAMINOPHEN 325 MG PO TABS
650.0000 mg | ORAL_TABLET | ORAL | Status: DC | PRN
  Administered 2023-06-24: 650 mg via ORAL
  Filled 2023-06-23: qty 2

## 2023-06-23 MED ORDER — MAGNESIUM SULFATE 40 GM/1000ML IV SOLN
2.0000 g/h | INTRAVENOUS | Status: DC
  Administered 2023-06-23 – 2023-06-24 (×2): 2 g/h via INTRAVENOUS
  Filled 2023-06-23 (×2): qty 1000

## 2023-06-23 MED ORDER — BETAMETHASONE SOD PHOS & ACET 6 (3-3) MG/ML IJ SUSP
12.0000 mg | INTRAMUSCULAR | Status: AC
  Administered 2023-06-23 – 2023-06-24 (×2): 12 mg via INTRAMUSCULAR
  Filled 2023-06-23: qty 5

## 2023-06-23 MED ORDER — DOCUSATE SODIUM 100 MG PO CAPS
100.0000 mg | ORAL_CAPSULE | Freq: Every day | ORAL | Status: DC
  Administered 2023-06-25: 100 mg via ORAL
  Filled 2023-06-23: qty 1

## 2023-06-23 MED ORDER — MAGNESIUM SULFATE 40 GM/1000ML IV SOLN: 2.0000 g/h | Freq: Once | INTRAVENOUS | Status: DC

## 2023-06-23 MED ORDER — BETAMETHASONE SOD PHOS & ACET 6 (3-3) MG/ML IJ SUSP: 12.0000 mg | INTRAMUSCULAR | Status: DC

## 2023-06-23 MED ORDER — SODIUM CHLORIDE 0.9 % IV SOLN
5.0000 10*6.[IU] | Freq: Once | INTRAVENOUS | Status: AC
  Administered 2023-06-23: 5 10*6.[IU] via INTRAVENOUS
  Filled 2023-06-23: qty 5

## 2023-06-23 MED ORDER — SODIUM CHLORIDE 0.9 % IV SOLN
5.0000 10*6.[IU] | Freq: Once | INTRAVENOUS | Status: DC
  Filled 2023-06-23 (×2): qty 5

## 2023-06-23 MED ORDER — NIFEDIPINE 10 MG PO CAPS
10.0000 mg | ORAL_CAPSULE | ORAL | Status: DC | PRN
  Administered 2023-06-23 (×2): 10 mg via ORAL
  Filled 2023-06-23 (×2): qty 1

## 2023-06-23 MED ORDER — FAMOTIDINE 20 MG PO TABS
20.0000 mg | ORAL_TABLET | Freq: Every day | ORAL | Status: DC
  Administered 2023-06-23 – 2023-06-25 (×3): 20 mg via ORAL
  Filled 2023-06-23 (×3): qty 1

## 2023-06-23 NOTE — H&P (Addendum)
Tara Watson is a 32 y.o. female G3P1011 at 100w3d presenting for PTC.  Patient states she first noticed CTX around MN.  Around 5 am, she was able to ignore them but they have gradually intensified with time.  CVX on arrival 2.5/20/-3.  Recheck by different examiner 3/th/-2.  She was given Procardia x 2 doses in MAU and noticed improvement in CTX but with second dose, developed flushing and tachycardia in 140s.  For this reason, she was not given terbutaline.  IVF were started on arrival and did not seem to slow CTX.  Was given first dose of BMZ in MAU.  Active FM.  No VB or LOF.  Patient had IC last night and FFN was not collected for this reason.  Antepartum course complicated by migraines.  Currently, patient states she is feeling CTX q 3-4 minutes.    OB History     Gravida  3   Para  1   Term  1   Preterm  0   AB  1   Living  1      SAB  1   IAB  0   Ectopic  0   Multiple  0   Live Births  1          Past Medical History:  Diagnosis Date   Gallstones    Migraines    Past Surgical History:  Procedure Laterality Date   CHOLECYSTECTOMY N/A 06/07/2015   Procedure: LAPAROSCOPIC CHOLECYSTECTOMY WITH INTRAOPERATIVE CHOLANGIOGRAM;  Surgeon: Chevis Pretty III, MD;  Location: [facility] OR;  Service: General;  Laterality: N/A;   TONSILLECTOMY     TYMPANOSTOMY TUBE PLACEMENT     Family History: family history is not on file. Social History:  reports that she has never smoked. She has never used smokeless tobacco. She reports that she does not drink alcohol and does not use drugs.     Maternal Diabetes: No Genetic Screening: Normal Maternal Ultrasounds/Referrals: Normal Fetal Ultrasounds or other Referrals:  None Maternal Substance Abuse:  No Significant Maternal Medications:  None Significant Maternal Lab Results:  None Number of Prenatal Visits:greater than 3 verified prenatal visits Other Comments:  None  Review of Systems Maternal Medical History:  Reason for admission:  Contractions.   Contractions: Onset was 13-24 hours ago.   Frequency: regular.   Perceived severity is mild.   Fetal activity: Perceived fetal activity is normal.   Last perceived fetal movement was within the past hour.   Prenatal complications: no prenatal complications Prenatal Complications - Diabetes: none.   Dilation: 3 Effacement (%): Thick Station: -2 Exam by:: Eckstat,MD Blood pressure 105/66, pulse (!) 112, temperature 98.1 F (36.7 C), resp. rate 19, height 4\' 11"  (1.499 m), weight 88.5 kg, SpO2 99 %. Maternal Exam:  Uterine Assessment: Contraction strength is mild.  Contraction frequency is regular.  Abdomen: Patient reports no abdominal tenderness. Fundal height is c/w dates.     Fetal Exam Fetal Monitor Review: Baseline rate: 130.  Variability: moderate (6-25 bpm).   Pattern: accelerations present and no decelerations.   Fetal State Assessment: Category I - tracings are normal.   Physical Exam Constitutional:      Appearance: Normal appearance.  HENT:     Head: Normocephalic and atraumatic.  Pulmonary:     Effort: Pulmonary effort is normal.  Abdominal:     Palpations: Abdomen is soft.  Musculoskeletal:        General: Normal range of motion.     Cervical back: Normal range  of motion.  Skin:    General: Skin is warm and dry.  Neurological:     Mental Status: She is alert and oriented to person, place, and time.  Psychiatric:        Mood and Affect: Mood normal.        Behavior: Behavior normal.     Prenatal labs: ABO, Rh: --/--/PENDING (07/10 1510) Antibody: PENDING (07/10 1510) Rubella:  Immune RPR:   NR HBsAg:   Negative HIV:   NR GBS:   Unknown  Assessment/Plan: 32yo G3P1011 at [redacted]w[redacted]d with threatened PTL -BMZ -Tocolysis through BMZ maturity-unable to use Procardia given flushing/tachycardia.  Also, tachycardia precludes use of terbutaline.  Will use magnesium sulfate.  -CEFM/toco -NICU consult placed -GBS cx pending -PCN for GBS  unknown.  Patient has allergy to Ceftin but she reports she has safely taken amoxicillin previously. -SCDs -MFM u/s ordered  Mitchel Honour 06/23/2023, 3:24 PM

## 2023-06-23 NOTE — MAU Note (Signed)
.  Tara Watson is a 32 y.o. at [redacted]w[redacted]d here in MAU reporting: started having ctx last night . Were about 5-10 min and strong. Have lessened  today but still having pelvic pain and pressure. Fetal movement less than usual . Denies any vag bleeding or leaking.  LMP:  Onset of complaint: last night Pain score: 3 Vitals:   06/23/23 1130  BP: 111/80  Pulse: 93  Resp: 18  Temp: 98.1 F (36.7 C)     FHT:150 Lab orders placed from triage: U/A

## 2023-06-23 NOTE — MAU Provider Note (Addendum)
History     CSN: [id number]  Arrival date and time: 06/23/23 1118   Event Date/Time   First Provider Initiated Contact with Patient 06/23/23 1206       Chief Complaint  Patient presents with   Contractions   HPI [name] is a 32 y.o. G3P1011 at [redacted]w[redacted]d who presents to MAU today with complaint of contractions since midnight. Initially q 5-10 minutes lasting ~ 45 seconds each. Since about 8am she feels contractions are only a few each hour, but she has noted increased pelvic pressure with the urgency to have a BM. She reports +FM, although slightly less than normal. She has had nausea that she feels is due to pain, but denies recent vomiting, diarrhea, UTI symptoms, vaginal bleeding or LOF. Last intercourse was yesterday.    OB History     Gravida  3   Para  1   Term  1   Preterm  0   AB  1   Living  1      SAB  1   IAB  0   Ectopic  0   Multiple  0   Live Births  1           Past Medical History:  Diagnosis Date   Gallstones    Migraines     Past Surgical History:  Procedure Laterality Date   CHOLECYSTECTOMY N/A 06/07/2015   Procedure: LAPAROSCOPIC CHOLECYSTECTOMY WITH INTRAOPERATIVE CHOLANGIOGRAM;  Surgeon: Chevis Pretty III, MD;  Location: [facility] OR;  Service: General;  Laterality: N/A;   TONSILLECTOMY     TYMPANOSTOMY TUBE PLACEMENT      History reviewed. No pertinent family history.  Social History   Tobacco Use   Smoking status: Never   Smokeless tobacco: Never  Vaping Use   Vaping Use: Never used  Substance Use Topics   Alcohol use: No   Drug use: No    Allergies:  Allergies  Allergen Reactions   Imitrex [Sumatriptan] Anaphylaxis   Ceftin [Cefuroxime] Hives   Codeine Hives   Erythromycin Hives   Sulfa Antibiotics Other (See Comments)    Gland swelling     Medications Prior to Admission  Medication Sig Dispense Refill Last Dose   famotidine (PEPCID) 10 MG tablet Take 10 mg by mouth 2 (two) times daily.   06/22/2023    Prenatal Vit-Fe Fumarate-FA (PRENATAL MULTIVITAMIN) TABS tablet Take 1 tablet by mouth daily at 12 noon.   06/22/2023   acetaminophen (TYLENOL) 325 MG tablet Take 1,000 mg by mouth every 6 (six) hours as needed.       Review of Systems  Constitutional:  Negative for fever.  Gastrointestinal:  Positive for abdominal pain and nausea. Negative for constipation, diarrhea and vomiting.  Genitourinary:  Negative for dysuria, frequency, urgency, vaginal bleeding, vaginal discharge and vaginal pain.   Physical Exam   Blood pressure 112/76, pulse 87, temperature 98.1 F (36.7 C), resp. rate 18, height 4\' 11"  (1.499 m), weight 88.5 kg, SpO2 100 %.  Physical Exam Vitals and nursing note reviewed. Exam conducted with a chaperone present.  Constitutional:      General: She is not in acute distress.    Appearance: Normal appearance. She is well-developed.  HENT:     Head: Normocephalic and atraumatic.  Cardiovascular:     Rate and Rhythm: Normal rate.  Pulmonary:     Effort: Pulmonary effort is normal.  Abdominal:     General: Abdomen is flat. There is no distension.  Palpations: Abdomen is soft.  Genitourinary:    General: Normal vulva.  Skin:    General: Skin is warm and dry.     Findings: No erythema.  Neurological:     Mental Status: She is alert and oriented to person, place, and time.  Psychiatric:        Mood and Affect: Mood normal.   Cervical Exam:  Dilation: 2.5 Effacement (%): 20 Cervical Position: Middle Station: -3 Presentation: Undeterminable Exam by:: Vonzella Nipple, PA-C  Fetal Monitoring: Baseline: 130 bpm Variability: moderate Accelerations: 15 x 15 Decelerations: none Contractions: q 3-5 minutes  MAU Course  Procedures None  MDM Intercourse yesterday, unable to obtain FFN Cervical exam performed as above  LR bolus ordered  10mg  Procardia q 20 minutes x 3 PRN for contractions ordered   1242 - care turned over to [facility], MD  Vonzella Nipple,  PA-C 06/23/2023, 12:43 PM  Assessment and Plan     Attestation of Attending Supervision of Advanced Practice Provider (PA/CNM/NP): Evaluation and management procedures were performed by the Advanced Practice Provider under my supervision and collaboration.  I have reviewed the Advanced Practice Provider's note and chart, and I agree with the management and plan except as noted below.  Patient given two doses of procardia but with the second dose developed flushing and tachycardia into the 140's. Contractions did slow but then resumed again every 3-4 min. Patient uncomfortable. On recheck of cervix she was a solid 3/Thick/-2. Although different examiners given patient appears uncomfortable and may have had some progression of her cervix I spoke with Dr. Langston Masker and recommended Obs admission, she was in agreement. I also gave patient a dose of betamethasone.   Care turned over to private physician.   Venora Maples, MD/MPH Attending Family Medicine Physician, [facility], [facility]  2:34 PM 06/23/23

## 2023-06-23 NOTE — Progress Notes (Signed)
Patient reports CTX are less frequent but slightly stronger.  Rating pain 2/10.  Active FM.  U/S shows breech presentation, normal AFI FHT CAT I Toco UI  PCN and mag running BMZ#2 due tomorrow at [employer], DO

## 2023-06-24 LAB — CBC
HCT: 36.1 % (ref 36.0–46.0)
Hemoglobin: 12.2 g/dL (ref 12.0–15.0)
MCH: 29.8 pg (ref 26.0–34.0)
MCHC: 33.8 g/dL (ref 30.0–36.0)
MCV: 88.3 fL (ref 80.0–100.0)
Platelets: 220 10*3/uL (ref 150–400)
RBC: 4.09 MIL/uL (ref 3.87–5.11)
RDW: 13.2 % (ref 11.5–15.5)
WBC: 12.7 10*3/uL — ABNORMAL HIGH (ref 4.0–10.5)
nRBC: 0 % (ref 0.0–0.2)

## 2023-06-24 LAB — CULTURE, BETA STREP (GROUP B ONLY)

## 2023-06-24 NOTE — Progress Notes (Signed)
Antepartum Progress Note  S: Feeling well this AM. Having mild irregular contractions, reports they are uncomfortable but not painful. No VB or LOF. Excellent FM.  O:    06/24/2023    3:57 AM 06/24/2023   12:00 AM 06/23/2023   10:03 PM  Vitals with BMI  Systolic 101 101 454  Diastolic 70 65 58  Pulse 94 100 107   Gen: NAD Abd: soft, ND, NT, gravid EFM: reassuring Toco: irregular contractions with some irritability  A/P: 32yo G3P1011 at [redacted]w[redacted]d admitted for threatened PTL  - BMZ 7/10-7/11 (dose is due today 1330) - Was started on mag for tocolysis as patient had reaction to procardia resulting in tachycardia/flushing and was therefore was not a candidate for terbutaline at the time - will continue mag through steroid window - Continue GBS ppx with PCN - GBS pending - Continue cEFM while on magnesium - MFM [place] yesterday with normal AFI, breech presentation, I discussed with her if PTL and persistent breech presentation, she would be for a CS and she understands. - NICU consult pending - DVT ppx: SCDs - Regular diet  Will reassess after second steroid dose and likely stop magnesium/PCN if remains stable.  Jule Economy, MD

## 2023-06-24 NOTE — Progress Notes (Addendum)
Brief Progress Note  Feeling rare contractions per RN - just received second dose of BMZ. Will stop magnesium. FHT has been reassuring/reactive, can stop continuous EFM > qDay. Toco with irritability, few contractions over last few hours.  Jule Economy, MD

## 2023-06-25 NOTE — Progress Notes (Signed)
Patient ID: Tara Watson, female   DOB: December 28, 1990, 32 y.o.   MRN: [id number] Reports come cramping and mucous NST yesterday for decreased fetal movement  VSSAF FHR 150s with accels,  no ctxs,   Abd Gravid, nt [place] by me, frank breech Cx 2.5/thick/-3   A/P: 32yo G3P1011 at [redacted]w[redacted]d admitted for threatened PTL   - BMZ 7/10-7/11 completed - s/p magnesium, now off - GBS negative - discussed disposition.  Would rec increased rest and OOW.  She lives >1 hour away -Continue monitoring for now - Regular diet

## 2023-06-25 NOTE — Plan of Care (Signed)
  Problem: Education: Goal: Knowledge of disease or condition will improve Outcome: Progressing Goal: Knowledge of the prescribed therapeutic regimen will improve Outcome: Progressing Goal: Individualized Educational Video(s) Outcome: Progressing   Problem: Clinical Measurements: Goal: Complications related to the disease process, condition or treatment will be avoided or minimized Outcome: Progressing   Problem: Education: Goal: Knowledge of General Education information will improve Description: Including pain rating scale, medication(s)/side effects and non-pharmacologic comfort measures Outcome: Progressing   Problem: Health Behavior/Discharge Planning: Goal: Ability to manage health-related needs will improve Outcome: Progressing   Problem: Clinical Measurements: Goal: Ability to maintain clinical measurements within normal limits will improve Outcome: Progressing Goal: Will remain free from infection Outcome: Progressing Goal: Diagnostic test results will improve Outcome: Progressing Goal: Respiratory complications will improve Outcome: Progressing Goal: Cardiovascular complication will be avoided Outcome: Progressing   Problem: Activity: Goal: Risk for activity intolerance will decrease Outcome: Progressing   Problem: Nutrition: Goal: Adequate nutrition will be maintained Outcome: Progressing   Problem: Coping: Goal: Level of anxiety will decrease Outcome: Progressing   Problem: Elimination: Goal: Will not experience complications related to bowel motility Outcome: Progressing Goal: Will not experience complications related to urinary retention Outcome: Progressing   Problem: Pain Managment: Goal: General experience of comfort will improve Outcome: Progressing   Problem: Safety: Goal: Ability to remain free from injury will improve Outcome: Progressing   Problem: Skin Integrity: Goal: Risk for impaired skin integrity will decrease Outcome:  Progressing   Problem: Education: Goal: Knowledge of General Education information will improve Description: Including pain rating scale, medication(s)/side effects and non-pharmacologic comfort measures Outcome: Progressing   Problem: Health Behavior/Discharge Planning: Goal: Ability to manage health-related needs will improve Outcome: Progressing   Problem: Clinical Measurements: Goal: Ability to maintain clinical measurements within normal limits will improve Outcome: Progressing Goal: Will remain free from infection Outcome: Progressing Goal: Diagnostic test results will improve Outcome: Progressing Goal: Respiratory complications will improve Outcome: Progressing Goal: Cardiovascular complication will be avoided Outcome: Progressing   Problem: Activity: Goal: Risk for activity intolerance will decrease Outcome: Progressing   Problem: Nutrition: Goal: Adequate nutrition will be maintained Outcome: Progressing   Problem: Coping: Goal: Level of anxiety will decrease Outcome: Progressing   Problem: Elimination: Goal: Will not experience complications related to bowel motility Outcome: Progressing Goal: Will not experience complications related to urinary retention Outcome: Progressing   Problem: Pain Managment: Goal: General experience of comfort will improve Outcome: Progressing   Problem: Safety: Goal: Ability to remain free from injury will improve Outcome: Progressing   Problem: Skin Integrity: Goal: Risk for impaired skin integrity will decrease Outcome: Progressing   

## 2023-06-26 NOTE — Plan of Care (Signed)
Pt to be discharged home with printed instructions. Mikiyah Glasner L Bryer Cozzolino

## 2023-06-26 NOTE — Discharge Summary (Signed)
Physician Discharge Summary  Patient ID: Tara Watson MRN: [id number] DOB/AGE: 1991-01-10 32 y.o.  Admit date: 06/23/2023 Discharge date: 06/26/2023  Admission Diagnoses:Preterm labor, Breech presentation  Discharge Diagnoses: same Principal Problem:   Threatened preterm labor, third trimester   Discharged Condition: good  Hospital Course: admitted with ctxs and cervical change.  Magnesium tocolysis and betamethsone series given.  After completion of magneium, uneventfull antenatal care with rare ctxs and no cx change. Cx 2/long/-3 complete breech.  Discharged home on increased rest and preterm labor warnings. GBS neg  Consults: NICU  Significant Diagnostic Studies: Ultrasound breech  Treatments: IV hydration and magnesium, betamethasone and antibiotics  Discharge Exam: Blood pressure 114/69, pulse 71, temperature 98 F (36.7 C), temperature source Oral, resp. rate 18, height 4\' 11"  (1.499 m), weight 88.5 kg, SpO2 98%.  Abd gravid nt Cx 2.5/long/-3 Disposition: Discharge disposition: 01-Home or Self Care       Discharge Instructions     Discharge activity:   Complete by: As directed    Increased rest   Discharge diet:  No restrictions   Complete by: As directed    No sexual activity restrictions   Complete by: As directed    Notify physician for a general feeling that "something is not right"   Complete by: As directed    Notify physician for leaking of fluid   Complete by: As directed    Notify physician for uterine contractions.  These may be painless and feel like the uterus is tightening or the baby is  "balling up"   Complete by: As directed    More than 5 and hour not responding to rest and fluids   Notify physician for vaginal bleeding   Complete by: As directed    PRETERM LABOR:  Includes any of the follwing symptoms that occur between 20 - [redacted] weeks gestation.  If these symptoms are not stopped, preterm labor can result in preterm delivery, placing your  baby at risk   Complete by: As directed       Allergies as of 06/26/2023       Reactions   Imitrex [sumatriptan] Anaphylaxis   Ceftin [cefuroxime] Hives   Codeine Hives   Erythromycin Hives   Sulfa Antibiotics Other (See Comments)   Gland swelling        Medication List     TAKE these medications    acetaminophen 325 MG tablet Commonly known as: TYLENOL Take 1,000 mg by mouth every 6 (six) hours as needed.   famotidine 10 MG tablet Commonly known as: PEPCID Take 10 mg by mouth 2 (two) times daily.   prenatal multivitamin Tabs tablet Take 1 tablet by mouth daily at 12 noon.         Signed: Turner Daniels 06/26/2023, 9:34 AM

## 2023-06-27 ENCOUNTER — Inpatient Hospital Stay (HOSPITAL_COMMUNITY)
Admission: AD | Admit: 2023-06-27 | Discharge: 2023-07-01 | DRG: 788 | Disposition: A | Discharge status: Home / Self Care | Payer: 59 | Attending: Obstetrics and Gynecology | Admitting: Obstetrics and Gynecology

## 2023-06-27 ENCOUNTER — Other Ambulatory Visit: Payer: Self-pay

## 2023-06-27 ENCOUNTER — Encounter (HOSPITAL_COMMUNITY): Payer: Self-pay | Admitting: Obstetrics and Gynecology

## 2023-06-27 DIAGNOSIS — O321XX0 Maternal care for breech presentation, not applicable or unspecified: Principal | ICD-10-CM | POA: Diagnosis present

## 2023-06-27 DIAGNOSIS — Z3A34 34 weeks gestation of pregnancy: Secondary | ICD-10-CM

## 2023-06-27 DIAGNOSIS — O4703 False labor before 37 completed weeks of gestation, third trimester: Secondary | ICD-10-CM | POA: Diagnosis present

## 2023-06-27 DIAGNOSIS — O4700 False labor before 37 completed weeks of gestation, unspecified trimester: Secondary | ICD-10-CM

## 2023-06-27 DIAGNOSIS — O99893 Other specified diseases and conditions complicating puerperium: Secondary | ICD-10-CM | POA: Diagnosis not present

## 2023-06-27 DIAGNOSIS — R339 Retention of urine, unspecified: Secondary | ICD-10-CM | POA: Diagnosis not present

## 2023-06-27 LAB — URINALYSIS, ROUTINE W REFLEX MICROSCOPIC
Bilirubin Urine: NEGATIVE
Glucose, UA: NEGATIVE mg/dL
Hgb urine dipstick: NEGATIVE
Ketones, ur: NEGATIVE mg/dL
Leukocytes,Ua: NEGATIVE
Nitrite: NEGATIVE
Protein, ur: NEGATIVE mg/dL
Specific Gravity, Urine: 1.003 — ABNORMAL LOW (ref 1.005–1.030)
pH: 7 (ref 5.0–8.0)

## 2023-06-27 LAB — TYPE AND SCREEN
ABO/RH(D): O POS
Antibody Screen: NEGATIVE

## 2023-06-27 MED ORDER — LACTATED RINGERS IV SOLN: INTRAVENOUS | Status: DC

## 2023-06-27 MED ORDER — LACTATED RINGERS IV BOLUS
1000.0000 mL | Freq: Once | INTRAVENOUS | Status: AC
  Administered 2023-06-27: 1000 mL via INTRAVENOUS

## 2023-06-27 MED ORDER — HYDROXYZINE HCL 50 MG/ML IM SOLN
25.0000 mg | Freq: Four times a day (QID) | INTRAMUSCULAR | Status: DC | PRN
  Administered 2023-06-28: 25 mg via INTRAMUSCULAR
  Filled 2023-06-27 (×2): qty 0.5

## 2023-06-27 MED ORDER — FENTANYL CITRATE (PF) 100 MCG/2ML IJ SOLN
100.0000 ug | INTRAMUSCULAR | Status: DC | PRN
  Administered 2023-06-27: 100 ug via INTRAVENOUS
  Filled 2023-06-27: qty 2

## 2023-06-27 MED ORDER — DOCUSATE SODIUM 100 MG PO CAPS
100.0000 mg | ORAL_CAPSULE | Freq: Every day | ORAL | Status: DC
  Administered 2023-06-29: 100 mg via ORAL
  Filled 2023-06-27: qty 1

## 2023-06-27 MED ORDER — LACTATED RINGERS IV SOLN
125.0000 mL/h | INTRAVENOUS | Status: AC
  Administered 2023-06-28: 125 mL/h via INTRAVENOUS

## 2023-06-27 MED ORDER — PRENATAL MULTIVITAMIN CH: 1.0000 | ORAL_TABLET | Freq: Every day | ORAL | Status: DC

## 2023-06-27 MED ORDER — ACETAMINOPHEN 325 MG PO TABS: 650.0000 mg | ORAL_TABLET | ORAL | Status: DC | PRN

## 2023-06-27 MED ORDER — CALCIUM CARBONATE ANTACID 500 MG PO CHEW: 2.0000 | CHEWABLE_TABLET | ORAL | Status: DC | PRN

## 2023-06-27 NOTE — H&P (Signed)
Chief Complaint:  Contractions    Event Date/Time   First Provider Initiated Contact with Patient 06/27/23 1429       HPI: Tara Watson is a 32 y.o. G3P1011 at [redacted]w[redacted]d who presents to maternity admissions reporting onset of painful regular contractions at 0300. She had hospital admission on 06/23/23 to 06/25/23 for preterm labor and was discharged with cervix unchanged at 3cm/thick/-2.  She felt better until this morning when the contractions returned.   She reports good fetal movement, denies LOF, or vaginal bleeding. Fetus has been in persistent breech position on prior scans.          HPI   Past Medical History:     Past Medical History:  Diagnosis Date   Gallstones     Migraines            Past obstetric history:                 OB History  Gravida Para Term Preterm AB Living  3 1 1  0 1 1  SAB IAB Ectopic Multiple Live Births     1 0 0 0 1        # Outcome Date GA Lbr Len/2nd Weight Sex Type Anes PTL Lv  3 Current                    2 Term 05/17/15 [redacted]w[redacted]d 09:16 / 03:44 2815 g M Vag-Vacuum EPI   LIV  1 SAB                        Past Surgical History:      Past Surgical History:  Procedure Laterality Date   CHOLECYSTECTOMY N/A 06/07/2015    Procedure: LAPAROSCOPIC CHOLECYSTECTOMY WITH INTRAOPERATIVE CHOLANGIOGRAM;  Surgeon: Chevis Pretty III, MD;  Location: [facility] OR;  Service: General;  Laterality: N/A;   TONSILLECTOMY       TYMPANOSTOMY TUBE PLACEMENT              Family History: History reviewed. No pertinent family history.       Social History: Social History  Social History        Tobacco Use   Smoking status: Never   Smokeless tobacco: Never  Vaping Use   Vaping status: Never Used  Substance Use Topics   Alcohol use: No   Drug use: No        Allergies:  Allergies       Allergies  Allergen Reactions   Imitrex [Sumatriptan] Anaphylaxis   Ceftin [Cefuroxime] Hives   Codeine Hives   Erythromycin Hives   Sulfa Antibiotics Other (See Comments)       Gland swelling     Nifedipine Palpitations      Patient became tachycardic         Meds:         Medications Prior to Admission  Medication Sig Dispense Refill Last Dose   famotidine (PEPCID) 10 MG tablet Take 10 mg by mouth 2 (two) times daily.     06/26/2023   Prenatal Vit-Fe Fumarate-FA (PRENATAL MULTIVITAMIN) TABS tablet Take 1 tablet by mouth daily at 12 noon.     06/26/2023   acetaminophen (TYLENOL) 325 MG tablet Take 1,000 mg by mouth every 6 (six) hours as needed.                ROS:  Review of Systems  Constitutional:  Negative for chills, fatigue and fever.  Eyes:  Negative for visual disturbance.  Respiratory:  Negative for shortness of breath.   Cardiovascular:  Negative for chest pain.  Gastrointestinal:  Positive for abdominal pain. Negative for nausea and vomiting.  Genitourinary:  Negative for difficulty urinating, dysuria, flank pain, pelvic pain, vaginal bleeding, vaginal discharge and vaginal pain.  Musculoskeletal:  Positive for back pain.  Neurological:  Negative for dizziness and headaches.  Psychiatric/Behavioral: Negative.          I have reviewed patient's Past Medical Hx, Surgical Hx, Family Hx, Social Hx, medications and allergies.    Physical Exam  Patient Vitals for the past 24 hrs:   BP Temp Temp src Pulse Resp SpO2  06/27/23 1354 112/78 98.2 F (36.8 C) Oral 99 14 100 %    Constitutional: Well-developed, well-nourished female in no acute distress.  Cardiovascular: normal rate Respiratory: normal effort GI: Abd soft, non-tender, gravid appropriate for gestational age.  MS: Extremities nontender, no edema, normal ROM Neurologic: Alert and oriented x 4.  GU: Neg CVAT.   PELVIC EXAM:    Dilation: 4 Effacement (%): 50 Exam by:: Sharen Counter, CNM   FHT:  Baseline 135 , moderate variability, accelerations present, no decelerations Contractions: q 3-4 mins, mild to moderate to palpation   Labs: Lab Results Last 24 Hours        Results for orders placed or performed during the hospital encounter of 06/27/23 (from the past 24 hour(s))  Urinalysis, Routine w reflex microscopic -Urine, Clean Catch     Status: Abnormal    Collection Time: 06/27/23  1:59 PM  Result Value Ref Range    Color, Urine STRAW (A) YELLOW    APPearance CLEAR CLEAR    Specific Gravity, Urine 1.003 (L) 1.005 - 1.030    pH 7.0 5.0 - 8.0    Glucose, UA NEGATIVE NEGATIVE mg/dL    Hgb urine dipstick NEGATIVE NEGATIVE    Bilirubin Urine NEGATIVE NEGATIVE    Ketones, ur NEGATIVE NEGATIVE mg/dL    Protein, ur NEGATIVE NEGATIVE mg/dL    Nitrite NEGATIVE NEGATIVE    Leukocytes,Ua NEGATIVE NEGATIVE      --/--/O POS (07/10 1510)   Imaging:    MAU Course/MDM:    Orders Placed This Encounter  Procedures   Urinalysis, Routine w reflex microscopic -Urine, Clean Catch   Diet clear liquid Room service appropriate? Yes; Fluid consistency: Thin   Notify physician (specify)   Vital signs   Defer vaginal exam for vaginal bleeding or PROM <37 weeks   Apply Antepartum Care Plan   Initiate Oral Care Protocol   Initiate Carrier Fluid Protocol   SCDs   Continuous tocometry   Bed rest with bathroom privileges   Patient may shower   Full code   Type and screen [facility]   Place in observation (patient's expected length of stay will be less than 2 midnights)       Meds ordered this encounter  Medications   lactated ringers bolus 1,000 mL   lactated ringers infusion   acetaminophen (TYLENOL) tablet 650 mg   docusate sodium (COLACE) capsule 100 mg   calcium carbonate (TUMS - dosed in mg elemental calcium) chewable tablet 400 mg of elemental calcium   prenatal multivitamin tablet 1 tablet   lactated ringers infusion   hydrOXYzine (VISTARIL) injection 25 mg      NST reviewed and reactive Cervix 4/50/-2, breech on intake exam  Cervix unchanged in 4 hours in MAU but pt with regular contractions that have not  resolved and lives > 1  hour from the hospital Called Dr Rana Snare and plan to observe overnight on antepartum Offered sedating medications per Dr Rana Snare, like morphine, to see if some reduction in contractions, but pt declined. Offered hydroxyzine Q 6 hours. Pt agrees to take PRN at least for sleep.    IV fluids at 75 ml/hour Clear liquids for now, RN to call Dr Rana Snare in 3-4 hours and may advance and put in regular diet Continuous toco   Assessment: 1. Preterm contractions   2. [redacted] weeks gestation of pregnancy       Plan: Transfer to [facility] Unit for observation       Sharen Counter Certified Nurse-Midwife 06/27/2023 6:18 PM    Dezyrae is having mild ctxs now and wants to eat GFM VSSAF FHR 140s with accels Mild ctxs q5-10"  IUP at 34 weeks PTL with cervical change from previous d/c but none since presentation to hospital Currently in latent versus false labor S/P BMZ and magnesium Didn't tolerate nifedipine and declines Terbutaline or sedation Lives 1 hour away so doesn't want to go home due to concerns about making it back Plan 24 observation, will use hydroxyzine 25mg  po q 6 h If labors, then C/S for breech and labor If false labor, consider d/c home GBS negative Will keep IVF at low rate DL

## 2023-06-27 NOTE — MAU Note (Signed)
.  AYDIN SEFTON is a 32 y.o. at [redacted]w[redacted]d here in MAU reporting: ctx that started around 0300 this morning. For the last two hours they have been every two minutes. She was admitted to the hospital on Wednesday for PTL. Patient received magnesium and was 2.5 cm prior to discharge and frank breech. Denies VB or LOF today. +FM.   Pain score: 6 Vitals:   06/27/23 1354  BP: 112/78  Pulse: 99  Resp: 14  Temp: 98.2 F (36.8 C)  SpO2: 100%     FHT:141 Lab orders placed from triage:  UA

## 2023-06-27 NOTE — MAU Provider Note (Signed)
Chief Complaint:  Contractions   Event Date/Time   First Provider Initiated Contact with Patient 06/27/23 1429      HPI: Tara Watson is a 32 y.o. G3P1011 at [redacted]w[redacted]d who presents to maternity admissions reporting onset of painful regular contractions at 0300. She had hospital admission on 06/23/23 to 06/25/23 for preterm labor and was discharged with cervix unchanged at 3cm/thick/-2.  She felt better until this morning when the contractions returned.   She reports good fetal movement, denies LOF, or vaginal bleeding. Fetus has been in persistent breech position on prior scans.      HPI  Past Medical History: Past Medical History:  Diagnosis Date   Gallstones    Migraines     Past obstetric history: OB History  Gravida Para Term Preterm AB Living  3 1 1  0 1 1  SAB IAB Ectopic Multiple Live Births  1 0 0 0 1    # Outcome Date GA Lbr Len/2nd Weight Sex Type Anes PTL Lv  3 Current           2 Term 05/17/15 [redacted]w[redacted]d 09:16 / 03:44 2815 g M Vag-Vacuum EPI  LIV  1 SAB             Past Surgical History: Past Surgical History:  Procedure Laterality Date   CHOLECYSTECTOMY N/A 06/07/2015   Procedure: LAPAROSCOPIC CHOLECYSTECTOMY WITH INTRAOPERATIVE CHOLANGIOGRAM;  Surgeon: Chevis Pretty III, MD;  Location: [facility] OR;  Service: General;  Laterality: N/A;   TONSILLECTOMY     TYMPANOSTOMY TUBE PLACEMENT      Family History: History reviewed. No pertinent family history.  Social History: Social History   Tobacco Use   Smoking status: Never   Smokeless tobacco: Never  Vaping Use   Vaping status: Never Used  Substance Use Topics   Alcohol use: No   Drug use: No    Allergies:  Allergies  Allergen Reactions   Imitrex [Sumatriptan] Anaphylaxis   Ceftin [Cefuroxime] Hives   Codeine Hives   Erythromycin Hives   Sulfa Antibiotics Other (See Comments)    Gland swelling    Nifedipine Palpitations    Patient became tachycardic     Meds:  Medications Prior to Admission  Medication  Sig Dispense Refill Last Dose   famotidine (PEPCID) 10 MG tablet Take 10 mg by mouth 2 (two) times daily.   06/26/2023   Prenatal Vit-Fe Fumarate-FA (PRENATAL MULTIVITAMIN) TABS tablet Take 1 tablet by mouth daily at 12 noon.   06/26/2023   acetaminophen (TYLENOL) 325 MG tablet Take 1,000 mg by mouth every 6 (six) hours as needed.       ROS:  Review of Systems  Constitutional:  Negative for chills, fatigue and fever.  Eyes:  Negative for visual disturbance.  Respiratory:  Negative for shortness of breath.   Cardiovascular:  Negative for chest pain.  Gastrointestinal:  Positive for abdominal pain. Negative for nausea and vomiting.  Genitourinary:  Negative for difficulty urinating, dysuria, flank pain, pelvic pain, vaginal bleeding, vaginal discharge and vaginal pain.  Musculoskeletal:  Positive for back pain.  Neurological:  Negative for dizziness and headaches.  Psychiatric/Behavioral: Negative.       I have reviewed patient's Past Medical Hx, Surgical Hx, Family Hx, Social Hx, medications and allergies.   Physical Exam  Patient Vitals for the past 24 hrs:  BP Temp Temp src Pulse Resp SpO2  06/27/23 1354 112/78 98.2 F (36.8 C) Oral 99 14 100 %   Constitutional: Well-developed, well-nourished female in no  acute distress.  Cardiovascular: normal rate Respiratory: normal effort GI: Abd soft, non-tender, gravid appropriate for gestational age.  MS: Extremities nontender, no edema, normal ROM Neurologic: Alert and oriented x 4.  GU: Neg CVAT.  PELVIC EXAM:   Dilation: 4 Effacement (%): 50 Exam by:: Sharen Counter, CNM  FHT:  Baseline 135 , moderate variability, accelerations present, no decelerations Contractions: q 3-4 mins, mild to moderate to palpation   Labs: Results for orders placed or performed during the hospital encounter of 06/27/23 (from the past 24 hour(s))  Urinalysis, Routine w reflex microscopic -Urine, Clean Catch     Status: Abnormal   Collection Time:  06/27/23  1:59 PM  Result Value Ref Range   Color, Urine STRAW (A) YELLOW   APPearance CLEAR CLEAR   Specific Gravity, Urine 1.003 (L) 1.005 - 1.030   pH 7.0 5.0 - 8.0   Glucose, UA NEGATIVE NEGATIVE mg/dL   Hgb urine dipstick NEGATIVE NEGATIVE   Bilirubin Urine NEGATIVE NEGATIVE   Ketones, ur NEGATIVE NEGATIVE mg/dL   Protein, ur NEGATIVE NEGATIVE mg/dL   Nitrite NEGATIVE NEGATIVE   Leukocytes,Ua NEGATIVE NEGATIVE   --/--/O POS (07/10 1510)  Imaging:   MAU Course/MDM: Orders Placed This Encounter  Procedures   Urinalysis, Routine w reflex microscopic -Urine, Clean Catch   Diet clear liquid Room service appropriate? Yes; Fluid consistency: Thin   Notify physician (specify)   Vital signs   Defer vaginal exam for vaginal bleeding or PROM <37 weeks   Apply Antepartum Care Plan   Initiate Oral Care Protocol   Initiate Carrier Fluid Protocol   SCDs   Continuous tocometry   Bed rest with bathroom privileges   Patient may shower   Full code   Type and screen [facility]   Place in observation (patient's expected length of stay will be less than 2 midnights)    Meds ordered this encounter  Medications   lactated ringers bolus 1,000 mL   lactated ringers infusion   acetaminophen (TYLENOL) tablet 650 mg   docusate sodium (COLACE) capsule 100 mg   calcium carbonate (TUMS - dosed in mg elemental calcium) chewable tablet 400 mg of elemental calcium   prenatal multivitamin tablet 1 tablet   lactated ringers infusion   hydrOXYzine (VISTARIL) injection 25 mg     NST reviewed and reactive Cervix 4/50/-2, breech on intake exam  Cervix unchanged in 4 hours in MAU but pt with regular contractions that have not resolved and lives > 1 hour from the hospital Called Dr Rana Snare and plan to observe overnight on antepartum Offered sedating medications per Dr Rana Snare, like morphine, to see if some reduction in contractions, but pt declined. Offered hydroxyzine Q 6 hours. Pt agrees  to take PRN at least for sleep.    IV fluids at 75 ml/hour Clear liquids for now, RN to call Dr Rana Snare in 3-4 hours and may advance and put in regular diet Continuous toco  Assessment: 1. Preterm contractions   2. [redacted] weeks gestation of pregnancy     Plan: Transfer to [facility] Unit for observation    Sharen Counter Certified Nurse-Midwife 06/27/2023 6:18 PM

## 2023-06-27 NOTE — MAU Note (Signed)

## 2023-06-28 ENCOUNTER — Observation Stay (HOSPITAL_COMMUNITY): Payer: 59 | Admitting: Anesthesiology

## 2023-06-28 ENCOUNTER — Encounter (HOSPITAL_COMMUNITY): Payer: Self-pay | Admitting: Obstetrics and Gynecology

## 2023-06-28 ENCOUNTER — Encounter (HOSPITAL_COMMUNITY)
Admission: AD | Disposition: A | Discharge status: Home / Self Care | Payer: Self-pay | Source: Home / Self Care | Attending: Obstetrics and Gynecology

## 2023-06-28 ENCOUNTER — Other Ambulatory Visit: Payer: Self-pay

## 2023-06-28 DIAGNOSIS — R339 Retention of urine, unspecified: Secondary | ICD-10-CM | POA: Diagnosis not present

## 2023-06-28 DIAGNOSIS — Z3A34 34 weeks gestation of pregnancy: Secondary | ICD-10-CM | POA: Diagnosis not present

## 2023-06-28 DIAGNOSIS — O321XX0 Maternal care for breech presentation, not applicable or unspecified: Secondary | ICD-10-CM | POA: Diagnosis not present

## 2023-06-28 DIAGNOSIS — O99893 Other specified diseases and conditions complicating puerperium: Secondary | ICD-10-CM | POA: Diagnosis not present

## 2023-06-28 DIAGNOSIS — Z3A35 35 weeks gestation of pregnancy: Secondary | ICD-10-CM | POA: Diagnosis not present

## 2023-06-28 LAB — CBC
HCT: 37.4 % (ref 36.0–46.0)
Hemoglobin: 12.6 g/dL (ref 12.0–15.0)
MCH: 30.1 pg (ref 26.0–34.0)
MCHC: 33.7 g/dL (ref 30.0–36.0)
MCV: 89.3 fL (ref 80.0–100.0)
Platelets: 198 10*3/uL (ref 150–400)
RBC: 4.19 MIL/uL (ref 3.87–5.11)
RDW: 13.2 % (ref 11.5–15.5)
WBC: 9.4 10*3/uL (ref 4.0–10.5)
nRBC: 0 % (ref 0.0–0.2)

## 2023-06-28 LAB — RPR: RPR Ser Ql: NONREACTIVE

## 2023-06-28 LAB — HIV ANTIBODY (ROUTINE TESTING W REFLEX): HIV Screen 4th Generation wRfx: NONREACTIVE

## 2023-06-28 SURGERY — Surgical Case
Anesthesia: Spinal

## 2023-06-28 MED ORDER — KETOROLAC TROMETHAMINE 30 MG/ML IJ SOLN
INTRAMUSCULAR | Status: AC
  Filled 2023-06-28: qty 1

## 2023-06-28 MED ORDER — MENTHOL 3 MG MT LOZG: 1.0000 | LOZENGE | OROMUCOSAL | Status: DC | PRN

## 2023-06-28 MED ORDER — NALOXONE HCL 4 MG/10ML IJ SOLN: 1.0000 ug/kg/h | INTRAVENOUS | Status: DC | PRN

## 2023-06-28 MED ORDER — OXYTOCIN-SODIUM CHLORIDE 30-0.9 UT/500ML-% IV SOLN
INTRAVENOUS | Status: DC | PRN
  Administered 2023-06-28: 200 mL via INTRAVENOUS

## 2023-06-28 MED ORDER — IBUPROFEN 600 MG PO TABS
600.0000 mg | ORAL_TABLET | Freq: Four times a day (QID) | ORAL | Status: DC
  Administered 2023-06-28 – 2023-07-01 (×11): 600 mg via ORAL
  Filled 2023-06-28 (×11): qty 1

## 2023-06-28 MED ORDER — DEXMEDETOMIDINE HCL IN NACL 80 MCG/20ML IV SOLN
INTRAVENOUS | Status: DC | PRN
  Administered 2023-06-28: 8 ug via INTRAVENOUS

## 2023-06-28 MED ORDER — SCOPOLAMINE 1 MG/3DAYS TD PT72
MEDICATED_PATCH | TRANSDERMAL | Status: DC | PRN
  Administered 2023-06-28: 1 via TRANSDERMAL

## 2023-06-28 MED ORDER — ACETAMINOPHEN 500 MG PO TABS
1000.0000 mg | ORAL_TABLET | Freq: Four times a day (QID) | ORAL | Status: DC
  Administered 2023-06-28 – 2023-07-01 (×10): 1000 mg via ORAL
  Filled 2023-06-28 (×11): qty 2

## 2023-06-28 MED ORDER — DIPHENHYDRAMINE HCL 25 MG PO CAPS: 25.0000 mg | ORAL_CAPSULE | ORAL | Status: DC | PRN

## 2023-06-28 MED ORDER — SIMETHICONE 80 MG PO CHEW: 80.0000 mg | CHEWABLE_TABLET | ORAL | Status: DC | PRN

## 2023-06-28 MED ORDER — PHENYLEPHRINE HCL-NACL 20-0.9 MG/250ML-% IV SOLN
INTRAVENOUS | Status: DC | PRN
  Administered 2023-06-28: 60 ug/min via INTRAVENOUS

## 2023-06-28 MED ORDER — CLINDAMYCIN PHOSPHATE 900 MG/50ML IV SOLN
900.0000 mg | INTRAVENOUS | Status: AC
  Administered 2023-06-28: 900 mg via INTRAVENOUS

## 2023-06-28 MED ORDER — KETOROLAC TROMETHAMINE 30 MG/ML IJ SOLN
30.0000 mg | Freq: Once | INTRAMUSCULAR | Status: AC | PRN
  Administered 2023-06-28: 30 mg via INTRAVENOUS

## 2023-06-28 MED ORDER — FENTANYL CITRATE (PF) 100 MCG/2ML IJ SOLN
INTRAMUSCULAR | Status: DC | PRN
  Administered 2023-06-28: 15 ug via INTRATHECAL

## 2023-06-28 MED ORDER — ZOLPIDEM TARTRATE 5 MG PO TABS: 5.0000 mg | ORAL_TABLET | Freq: Every evening | ORAL | Status: DC | PRN

## 2023-06-28 MED ORDER — ONDANSETRON HCL 4 MG/2ML IJ SOLN: 4.0000 mg | Freq: Three times a day (TID) | INTRAMUSCULAR | Status: DC | PRN

## 2023-06-28 MED ORDER — PHENYLEPHRINE 80 MCG/ML (10ML) SYRINGE FOR IV PUSH (FOR BLOOD PRESSURE SUPPORT)
PREFILLED_SYRINGE | INTRAVENOUS | Status: AC
  Filled 2023-06-28: qty 10

## 2023-06-28 MED ORDER — SOD CITRATE-CITRIC ACID 500-334 MG/5ML PO SOLN: 30.0000 mL | ORAL | Status: DC

## 2023-06-28 MED ORDER — SENNOSIDES-DOCUSATE SODIUM 8.6-50 MG PO TABS
2.0000 | ORAL_TABLET | Freq: Every day | ORAL | Status: DC
  Administered 2023-06-29 – 2023-06-30 (×2): 2 via ORAL
  Filled 2023-06-28 (×2): qty 2

## 2023-06-28 MED ORDER — OXYTOCIN-SODIUM CHLORIDE 30-0.9 UT/500ML-% IV SOLN
INTRAVENOUS | Status: AC
  Filled 2023-06-28: qty 500

## 2023-06-28 MED ORDER — STERILE WATER FOR IRRIGATION IR SOLN
Status: DC | PRN
  Administered 2023-06-28: 1000 mL

## 2023-06-28 MED ORDER — MORPHINE SULFATE (PF) 0.5 MG/ML IJ SOLN
INTRAMUSCULAR | Status: DC | PRN
  Administered 2023-06-28: 150 ug via INTRATHECAL

## 2023-06-28 MED ORDER — DEXAMETHASONE SODIUM PHOSPHATE 4 MG/ML IJ SOLN
INTRAMUSCULAR | Status: AC
  Filled 2023-06-28: qty 2

## 2023-06-28 MED ORDER — METOCLOPRAMIDE HCL 5 MG/ML IJ SOLN
INTRAMUSCULAR | Status: AC
  Filled 2023-06-28: qty 2

## 2023-06-28 MED ORDER — NALOXONE HCL 0.4 MG/ML IJ SOLN: 0.4000 mg | INTRAMUSCULAR | Status: DC | PRN

## 2023-06-28 MED ORDER — SODIUM CHLORIDE 0.9% FLUSH: 3.0000 mL | INTRAVENOUS | Status: DC | PRN

## 2023-06-28 MED ORDER — DIPHENHYDRAMINE HCL 25 MG PO CAPS: 25.0000 mg | ORAL_CAPSULE | Freq: Four times a day (QID) | ORAL | Status: DC | PRN

## 2023-06-28 MED ORDER — DEXAMETHASONE SODIUM PHOSPHATE 10 MG/ML IJ SOLN
INTRAMUSCULAR | Status: DC | PRN
  Administered 2023-06-28: 8 mg via INTRAVENOUS

## 2023-06-28 MED ORDER — SODIUM CHLORIDE 0.9 % IV SOLN: 500.0000 mg | INTRAVENOUS | Status: DC

## 2023-06-28 MED ORDER — SODIUM CHLORIDE 0.9 % IR SOLN
Status: DC | PRN
  Administered 2023-06-28: 1

## 2023-06-28 MED ORDER — COCONUT OIL OIL
1.0000 | TOPICAL_OIL | Status: DC | PRN
  Administered 2023-06-29: 1 via TOPICAL

## 2023-06-28 MED ORDER — PRENATAL MULTIVITAMIN CH
1.0000 | ORAL_TABLET | Freq: Every day | ORAL | Status: DC
  Administered 2023-06-28 – 2023-06-30 (×3): 1 via ORAL
  Filled 2023-06-28 (×3): qty 1

## 2023-06-28 MED ORDER — SIMETHICONE 80 MG PO CHEW
80.0000 mg | CHEWABLE_TABLET | Freq: Three times a day (TID) | ORAL | Status: DC
  Administered 2023-06-28 – 2023-07-01 (×8): 80 mg via ORAL
  Filled 2023-06-28 (×8): qty 1

## 2023-06-28 MED ORDER — DIPHENHYDRAMINE HCL 50 MG/ML IJ SOLN: 12.5000 mg | INTRAMUSCULAR | Status: DC | PRN

## 2023-06-28 MED ORDER — OXYTOCIN-SODIUM CHLORIDE 30-0.9 UT/500ML-% IV SOLN: 2.5000 [IU]/h | INTRAVENOUS | Status: AC

## 2023-06-28 MED ORDER — PHENYLEPHRINE HCL-NACL 20-0.9 MG/250ML-% IV SOLN
INTRAVENOUS | Status: AC
  Filled 2023-06-28: qty 250

## 2023-06-28 MED ORDER — GENTAMICIN SULFATE 40 MG/ML IJ SOLN
330.0000 mg | INTRAVENOUS | Status: AC
  Administered 2023-06-28: 330 mg via INTRAVENOUS
  Filled 2023-06-28 (×2): qty 8.25

## 2023-06-28 MED ORDER — ACETAMINOPHEN 10 MG/ML IV SOLN
INTRAVENOUS | Status: DC | PRN
  Administered 2023-06-28: 1000 mg via INTRAVENOUS

## 2023-06-28 MED ORDER — BUPIVACAINE IN DEXTROSE 0.75-8.25 % IT SOLN
INTRATHECAL | Status: DC | PRN
  Administered 2023-06-28: 1.4 mL via INTRATHECAL

## 2023-06-28 MED ORDER — FENTANYL CITRATE (PF) 100 MCG/2ML IJ SOLN
INTRAMUSCULAR | Status: AC
  Filled 2023-06-28: qty 2

## 2023-06-28 MED ORDER — DEXMEDETOMIDINE HCL IN NACL 80 MCG/20ML IV SOLN
INTRAVENOUS | Status: AC
  Filled 2023-06-28: qty 20

## 2023-06-28 MED ORDER — ONDANSETRON HCL 4 MG/2ML IJ SOLN
INTRAMUSCULAR | Status: AC
  Filled 2023-06-28: qty 2

## 2023-06-28 MED ORDER — SCOPOLAMINE 1 MG/3DAYS TD PT72: 1.0000 | MEDICATED_PATCH | Freq: Once | TRANSDERMAL | Status: DC

## 2023-06-28 MED ORDER — ONDANSETRON HCL 4 MG/2ML IJ SOLN
INTRAMUSCULAR | Status: DC | PRN
  Administered 2023-06-28: 4 mg via INTRAVENOUS

## 2023-06-28 MED ORDER — FENTANYL CITRATE (PF) 100 MCG/2ML IJ SOLN: 12.5000 ug | INTRAMUSCULAR | Status: DC | PRN

## 2023-06-28 MED ORDER — MORPHINE SULFATE (PF) 0.5 MG/ML IJ SOLN
INTRAMUSCULAR | Status: AC
  Filled 2023-06-28: qty 10

## 2023-06-28 MED ORDER — OXYCODONE HCL 5 MG PO TABS: 5.0000 mg | ORAL_TABLET | ORAL | Status: DC | PRN

## 2023-06-28 SURGICAL SUPPLY — 35 items
ADH SKN CLS APL DERMABOND .7 (GAUZE/BANDAGES/DRESSINGS)
APL PRP STRL LF DISP 70% ISPRP (MISCELLANEOUS) ×2
APL SKNCLS STERI-STRIP NONHPOA (GAUZE/BANDAGES/DRESSINGS) ×1
BENZOIN TINCTURE PRP APPL 2/3 (GAUZE/BANDAGES/DRESSINGS) ×1 IMPLANT
CHLORAPREP W/TINT 26 (MISCELLANEOUS) ×4 IMPLANT
CLAMP UMBILICAL CORD (MISCELLANEOUS) ×1 IMPLANT
CLOTH BEACON ORANGE TIMEOUT ST (SAFETY) ×1 IMPLANT
DERMABOND ADVANCED .7 DNX12 (GAUZE/BANDAGES/DRESSINGS) IMPLANT
DRSG OPSITE POSTOP 4X10 (GAUZE/BANDAGES/DRESSINGS) ×2 IMPLANT
ELECT REM PT RETURN 9FT ADLT (ELECTROSURGICAL) ×1
ELECTRODE REM PT RTRN 9FT ADLT (ELECTROSURGICAL) ×2 IMPLANT
EXTRACTOR VACUUM KIWI (MISCELLANEOUS) IMPLANT
GAUZE SPONGE 4X4 12PLY STRL LF (GAUZE/BANDAGES/DRESSINGS) IMPLANT
GLOVE BIOGEL PI IND STRL 6 (GLOVE) ×1 IMPLANT
GLOVE SS PI 5.5 STRL (GLOVE) ×1 IMPLANT
GOWN STRL REUS W/TWL LRG LVL3 (GOWN DISPOSABLE) ×2 IMPLANT
KIT ABG SYR 3ML LUER SLIP (SYRINGE) ×1 IMPLANT
NDL HYPO 25X5/8 SAFETYGLIDE (NEEDLE) ×1 IMPLANT
NEEDLE HYPO 25X5/8 SAFETYGLIDE (NEEDLE) ×1 IMPLANT
NS IRRIG 1000ML POUR BTL (IV SOLUTION) ×1 IMPLANT
PACK C SECTION WH (CUSTOM PROCEDURE TRAY) ×2 IMPLANT
PAD ABD 8X10 STRL (GAUZE/BANDAGES/DRESSINGS) IMPLANT
PAD OB MATERNITY 4.3X12.25 (PERSONAL CARE ITEMS) ×1 IMPLANT
RTRCTR C-SECT PINK 25CM LRG (MISCELLANEOUS) ×1 IMPLANT
STRIP CLOSURE SKIN 1/2X4 (GAUZE/BANDAGES/DRESSINGS) IMPLANT
SUT CHROMIC 2 0 CT 1 (SUTURE) ×1 IMPLANT
SUT MON AB 2-0 CT1 27 (SUTURE) ×2 IMPLANT
SUT MON AB-0 CT1 36 (SUTURE) ×1 IMPLANT
SUT PDS AB 0 CT1 27 (SUTURE) ×1 IMPLANT
SUT VIC AB 0 CTX 36 (SUTURE) ×2
SUT VIC AB 0 CTX36XBRD ANBCTRL (SUTURE) ×2 IMPLANT
SUT VIC AB 4-0 PS2 27 (SUTURE) ×1 IMPLANT
TOWEL OR 17X24 6PK STRL BLUE (TOWEL DISPOSABLE) ×1 IMPLANT
TRAY FOLEY W/BAG SLVR 14FR LF (SET/KITS/TRAYS/PACK) IMPLANT
WATER STERILE IRR 1000ML POUR (IV SOLUTION) ×1 IMPLANT

## 2023-06-28 NOTE — Progress Notes (Signed)
Pt requesting to get up out of bed. Pt was able to get up to the bathroom without issue. VSS. Pt to NICU.

## 2023-06-28 NOTE — Op Note (Signed)
PROCEDURE DATE: 06/28/2023   PREOPERATIVE DIAGNOSIS: Preterm labor, Breech presentation of fetus   POSTOPERATIVE DIAGNOSIS: The same, meconium stained amniotic fluid   PROCEDURE: Primary Low Transverse Cesarean Section   SURGEON:  Dr. Jule Economy   INDICATIONS: This is a 32yo G3P1011 at [redacted]w[redacted]d requiring cesarean section secondary to active preterm labor.   Decision made to proceed with LTCS. The risks of cesarean section discussed with the patient included but were not limited to: bleeding which may require transfusion or reoperation; infection which may require antibiotics; injury to bowel, bladder, ureters or other surrounding organs; injury to the fetus; need for additional procedures including hysterectomy in the event of a life-threatening hemorrhage; placental abnormalities wth subsequent pregnancies, incisional problems, thromboembolic phenomenon and other postoperative/anesthesia complications. The patient agreed with the proposed plan, giving informed consent for the procedure.     FINDINGS:  Viable female infant in frank breech presentation, APGARs per NICU/nursing record, Weight pending, Amniotic fluid meconium-stained, Intact placenta, three vessel cord.  Grossly normal uterus. .   ANESTHESIA:    Epidural ESTIMATED BLOOD LOSS: 528ccs SPECIMENS: Placenta for pathology COMPLICATIONS: None immediate    PROCEDURE IN DETAIL:  The patient received intravenous antibiotics (clindamycin and gentamicin) and had sequential compression devices applied to her lower extremities while in the preoperative area.  She was then taken to the operating room where spinal anesthesia obtained. She was then placed in a dorsal supine position with a leftward tilt, and prepped and draped in a sterile manner.  A foley catheter was placed into her bladder and attached to constant gravity.  After an adequate timeout was performed, a Pfannenstiel skin incision was made with scalpel and carried through to the underlying  layer of fascia. The fascia was incised in the midline and this incision was extended bilaterally with curved Mayos. Kocher clamps were applied to the superior aspect of the fascial incision and the underlying rectus muscles were dissected off bluntly and with curved Mayos. A similar process was carried out on the inferior aspect of the facial incision. The rectus muscles were separated in the midline bluntly and the peritoneum was entered bluntly.  The peritoneum was extended bilaterally and an Alexis retractor was placed for better visualization. A bladder flap was created sharply and developed bluntly. A transverse hysterotomy was made with a scalpel and extended bilaterally bluntly. The breech was grasped and the infant was successfully delivered using standard maneuvers. The cord was clamped and cut and infant was handed over to awaiting neonatology team. Cord blood was collected. Uterine massage was then administered and the placenta delivered intact with three-vessel cord. The uterus was cleared of clot and debris.  The hysterotomy was closed with 0 vicryl.  A second imbricating layer of 0-monocryl was used to reinforce the incision and aid in hemostasis. The peritoneal cavity was irrigated and excellent hemostasis was noted. The Alexis retractor was removed. The fascia was closed with 0-Vicryl in a running fashion with good restoration of anatomy.  The subcutaneus tissue was irrigated and was reapproximated using 2-0 monocryl.  The skin was closed with 4-0 Vicryl in a subcuticular fashion.  All surgical site and was hemostatic at end of procedure) without any further bleeding on exam.    Pt tolerated the procedure well. All sponge/lap/needle counts were correct  X 2. Pt taken to recovery room in stable condition.   Jule Economy, MD

## 2023-06-28 NOTE — Anesthesia Procedure Notes (Signed)
Spinal  Patient location during procedure: OR Start time: 06/28/2023 9:33 AM End time: 06/28/2023 9:35 AM Staffing Performed: anesthesiologist  Anesthesiologist: Atilano Median, DO Performed by: Atilano Median, DO Authorized by: Atilano Median, DO   Preanesthetic Checklist Completed: patient identified, IV checked, site marked, risks and benefits discussed, surgical consent, monitors and equipment checked, pre-op evaluation and timeout performed Spinal Block Patient position: sitting Prep: DuraPrep Patient monitoring: heart rate, cardiac monitor, continuous pulse ox and blood pressure Approach: midline Location: L3-4 Injection technique: single-shot Needle Needle type: Pencan  Needle gauge: 24 G Needle length: 10 cm Assessment Events: CSF return Additional Notes Patient identified. Risks/Benefits/Options discussed with patient including but not limited to bleeding, infection, nerve damage, paralysis, failed block, incomplete pain control, headache, blood pressure changes, nausea, vomiting, reactions to medications, itching and postpartum back pain. Confirmed with bedside nurse the patient's most recent platelet count. Confirmed with patient that they are not currently taking any anticoagulation, have any bleeding history or any family history of bleeding disorders. Patient expressed understanding and wished to proceed. All questions were answered. Sterile technique was used throughout the entire procedure. Please see nursing notes for vital signs. Warning signs of high block given to the patient including shortness of breath, tingling/numbness in hands, complete motor block, or any concerning symptoms with instructions to call for help. Patient was given instructions on fall risk and not to get out of bed. All questions and concerns addressed with instructions to call with any issues or inadequate analgesia.

## 2023-06-28 NOTE — Anesthesia Postprocedure Evaluation (Signed)
Anesthesia Post Note  Patient: Tara Watson  Procedure(s) Performed: CESAREAN SECTION     Patient location during evaluation: Mother Baby Anesthesia Type: Spinal Level of consciousness: oriented and awake and alert Pain management: pain level controlled Vital Signs Assessment: post-procedure vital signs reviewed and stable Respiratory status: spontaneous breathing and respiratory function stable Cardiovascular status: blood pressure returned to baseline and stable Postop Assessment: no headache, no backache, no apparent nausea or vomiting and able to ambulate Anesthetic complications: no   No notable events documented.  Last Vitals:  Vitals:   06/28/23 1352 06/28/23 1354  BP: 113/73 107/72  Pulse: 86 (!) 108  Resp:    Temp:    SpO2:      Last Pain:  Vitals:   06/28/23 1345  TempSrc: Oral  PainSc:                  Nelle Don Maalik Pinn

## 2023-06-28 NOTE — Lactation Note (Addendum)
This note was copied from a baby's chart.  NICU Lactation Consultation Note  Patient Name: Girl Shammara Jarrett [id number] Date: 06/28/2023 Age:31 hours  Reason for consult: Initial assessment; Late-preterm 34-36.6wks; Infant < 6lbs; NICU baby; 1st time breastfeeding  SUBJECTIVE Visited with family of 8 hours old LPI NICU female; Ms. Fink is a P2 but this is her first time breastfeeding. Baby " [name]" currently on CPAP + 6, 23% and on IV fluids. Her plan is to eventually take baby to breast once baby is ready along with pumping and bottle feeding. She hasn't started pumping yet, but plans on doing that tonight as soon as she gets back in her room. Reviewed pumping schedule, lactogenesis II and anticipatory guidelines.   OBJECTIVE Infant data: Mother's Current Feeding Choice: -- (NPO)  Maternal data: [id number] C-Section, Low Transverse Has patient been taught Hand Expression?: Yes Hand Expression Comments: no colostrum noted yet Significant Breast History:: moderate breast changes during the pregnancy Current breast feeding challenges:: NICU admission Does the patient have breastfeeding experience prior to this delivery?: No Pumping frequency: hasn't initiated pumping yet but pump has been set up in her room at 7 hours post-partum Flange Size: 21 Risk factor for low milk supply:: prematurity, infant separation, first time breasteeding (she didn't get engorged with her first baby when choosing not to breastfeed)  Pump: Personal (Spectra S2)  ASSESSMENT Infant: Feeding Status: NPO  Maternal: Breast are soft and tissue is compressible  INTERVENTIONS/PLAN Interventions: Interventions: Breast feeding basics reviewed; Coconut oil; DEBP; Education; Infant Driven Feeding Algorithm education Discharge Education: Warning signs for feeding baby Tools: Pump; Flanges; Coconut oil Pump Education: Setup, frequency, and cleaning; Milk Storage  Plan: Encouraged to start pumping every 3 hours,  ideally 8 pumping sessions/24 hours Breast massage, hand expression and coconut oil were also encouraged prior pumping  FOB and PGF present and supportive, but they stepped out during [facility] consultation to give Ms. Alona Bene some privacy. All questions and concerns answered, family to contact [facility] services PRN.  Consult Status: NICU follow-up NICU Follow-up type: New admission follow up   Jerson Furukawa S Philis Nettle 06/28/2023, 6:04 PM

## 2023-06-28 NOTE — Transfer of Care (Signed)
Immediate Anesthesia Transfer of Care Note  Patient: Tara Watson  Procedure(s) Performed: CESAREAN SECTION  Patient Location: PACU  Anesthesia Type:Spinal  Level of Consciousness: awake, alert , and oriented  Airway & Oxygen Therapy: Patient Spontanous Breathing  Post-op Assessment: Report given to RN and Post -op Vital signs reviewed and stable  Post vital signs: Reviewed and stable  Last Vitals:  Vitals Value Taken Time  BP 114/65 06/28/23 1056  Temp    Pulse 73 06/28/23 1058  Resp 16 06/28/23 1058  SpO2 97 % 06/28/23 1058  Vitals shown include unfiled device data.  Last Pain:  Vitals:   06/28/23 0813  TempSrc: Oral  PainSc:          Complications: No notable events documented.

## 2023-06-28 NOTE — Progress Notes (Signed)
Antepartum Progress Note  S: Has been feeling stronger contractions overnight into this AM. Fentanyl helped moderately.   O:    06/28/2023    8:13 AM 06/28/2023    4:23 AM 06/28/2023   12:05 AM  Vitals with BMI  Height 4' 11.016"    Weight 195 lbs 2 oz    BMI 39.39    Systolic 114 108 409  Diastolic 77 81 83  Pulse 79 80 93   Gen: NAD Abd: soft, intermittent contractions palpated - moderate intensiety, gravid NST overnight reactive BSUS: frank breech presentation SVE: 5.5/50/-2  Recent Results (from the past 2160 hour(s))  Urinalysis, Routine w reflex microscopic -Urine, Clean Catch     Status: Abnormal   Collection Time: 04/14/23 12:59 PM  Result Value Ref Range   Color, Urine YELLOW YELLOW   APPearance HAZY (A) CLEAR   Specific Gravity, Urine 1.004 (L) 1.005 - 1.030   pH 7.0 5.0 - 8.0   Glucose, UA NEGATIVE NEGATIVE mg/dL   Hgb urine dipstick NEGATIVE NEGATIVE   Bilirubin Urine NEGATIVE NEGATIVE   Ketones, ur NEGATIVE NEGATIVE mg/dL   Protein, ur NEGATIVE NEGATIVE mg/dL   Nitrite NEGATIVE NEGATIVE   Leukocytes,Ua LARGE (A) NEGATIVE   RBC / HPF 0-5 0 - 5 RBC/hpf   WBC, UA 11-20 0 - 5 WBC/hpf   Bacteria, UA RARE (A) NONE SEEN   Squamous Epithelial / HPF 11-20 0 - 5 /HPF    Comment: Performed at [facility], [address], [place], [place] [address]  Basic metabolic panel     Status: Abnormal   Collection Time: 04/14/23  2:53 PM  Result Value Ref Range   Sodium 135 135 - 145 mmol/L   Potassium 3.4 (L) 3.5 - 5.1 mmol/L   Chloride 103 98 - 111 mmol/L   CO2 23 22 - 32 mmol/L   Glucose, Bld 82 70 - 99 mg/dL    Comment: Glucose reference range applies only to samples taken after fasting for at least 8 hours.   BUN <5 (L) 6 - 20 mg/dL   Creatinine, Ser 4.78 0.44 - 1.00 mg/dL   Calcium 8.6 (L) 8.9 - 10.3 mg/dL   GFR, Estimated >29 >56 mL/min    Comment: (NOTE) Calculated using the CKD-EPI Creatinine Equation (2021)    Anion gap 9 5 - 15    Comment: Performed  at [facility], [address], [place], [place] [address]  Urinalysis, Routine w reflex microscopic -Urine, Clean Catch     Status: Abnormal   Collection Time: 06/23/23 11:41 AM  Result Value Ref Range   Color, Urine YELLOW YELLOW   APPearance HAZY (A) CLEAR   Specific Gravity, Urine 1.016 1.005 - 1.030   pH 6.0 5.0 - 8.0   Glucose, UA NEGATIVE NEGATIVE mg/dL   Hgb urine dipstick NEGATIVE NEGATIVE   Bilirubin Urine NEGATIVE NEGATIVE   Ketones, ur 5 (A) NEGATIVE mg/dL   Protein, ur NEGATIVE NEGATIVE mg/dL   Nitrite NEGATIVE NEGATIVE   Leukocytes,Ua NEGATIVE NEGATIVE    Comment: Performed at [facility], [address], [place], [place] [address]  Type and screen [facility]     Status: None   Collection Time: 06/23/23  3:10 PM  Result Value Ref Range   ABO/RH(D) O POS    Antibody Screen NEG    Sample Expiration      06/26/2023,2359 Performed at [facility], [address], [place], [place] [address]  Culture, beta strep (group b only)     Status: None   Collection Time: 06/23/23  5:02 PM   Specimen: Vaginal/Rectal; Genital  Result Value Ref Range   Specimen Description VAGINAL/RECTAL    Special Requests NONE    Culture      NO GROUP B STREP (S.AGALACTIAE) ISOLATED Performed at [facility], [address] [place], [place] [address]    Report Status 06/24/2023 FINAL   CBC     Status: Abnormal   Collection Time: 06/24/23  5:38 AM  Result Value Ref Range   WBC 12.7 (H) 4.0 - 10.5 K/uL   RBC 4.09 3.87 - 5.11 MIL/uL   Hemoglobin 12.2 12.0 - 15.0 g/dL   HCT 40.9 81.1 - 91.4 %   MCV 88.3 80.0 - 100.0 fL   MCH 29.8 26.0 - 34.0 pg   MCHC 33.8 30.0 - 36.0 g/dL   RDW 78.2 95.6 - 21.3 %   Platelets 220 150 - 400 K/uL   nRBC 0.0 0.0 - 0.2 %    Comment: Performed at [facility], [address], [place], [place] [address]  Urinalysis, Routine w reflex microscopic -Urine, Clean Catch     Status: Abnormal   Collection Time:  06/27/23  1:59 PM  Result Value Ref Range   Color, Urine STRAW (A) YELLOW   APPearance CLEAR CLEAR   Specific Gravity, Urine 1.003 (L) 1.005 - 1.030   pH 7.0 5.0 - 8.0   Glucose, UA NEGATIVE NEGATIVE mg/dL   Hgb urine dipstick NEGATIVE NEGATIVE   Bilirubin Urine NEGATIVE NEGATIVE   Ketones, ur NEGATIVE NEGATIVE mg/dL   Protein, ur NEGATIVE NEGATIVE mg/dL   Nitrite NEGATIVE NEGATIVE   Leukocytes,Ua NEGATIVE NEGATIVE    Comment: Performed at [facility], [address] [place], [place] [address]  Type and screen [facility]     Status: None   Collection Time: 06/27/23  2:32 PM  Result Value Ref Range   ABO/RH(D) O POS    Antibody Screen NEG    Sample Expiration      06/30/2023,2359 Performed at [facility], [address] [place], [place] [address]    A/P: [place] at [redacted]w[redacted]d admitted for PTL S/p BMZ 7/10-11 Overnight with more painful contractions. Cervix now 5.5cm and 50% effaced We discussed in setting of PTL and persistent breech presentation, I would recommend a cesarean section.  Risks discussed including infection, bleeding, damage to surrounding structures, the need for additional procedures including hysterectomy, and the possibility of uterine rupture with neonatal morbidity/mortality, scarring, and abnormal placentation with subsequent pregnancies. Patient agrees to proceed.   Consents previously signed with Dr. Rana Snare. All questions answered.  Tara Economy, MD

## 2023-06-28 NOTE — Anesthesia Preprocedure Evaluation (Signed)
Anesthesia Evaluation  Patient identified by MRN, date of birth, ID band Patient awake    Reviewed: Allergy & Precautions, NPO status , Patient's Chart, lab work & pertinent test results  Airway Mallampati: II  TM Distance: >3 FB Neck ROM: Full    Dental no notable dental hx.    Pulmonary neg pulmonary ROS   Pulmonary exam normal        Cardiovascular negative cardio ROS  Rhythm:Regular Rate:Normal     Neuro/Psych  Headaches  negative psych ROS   GI/Hepatic negative GI ROS, Neg liver ROS,,,  Endo/Other  negative endocrine ROS    Renal/GU negative Renal ROS  negative genitourinary   Musculoskeletal negative musculoskeletal ROS (+)    Abdominal Normal abdominal exam  (+)   Peds  Hematology Lab Results      Component                Value               Date                      WBC                      12.7 (H)            06/24/2023                HGB                      12.2                06/24/2023                HCT                      36.1                06/24/2023                MCV                      88.3                06/24/2023                PLT                      220                 06/24/2023              Anesthesia Other Findings   Reproductive/Obstetrics (+) Pregnancy                             Anesthesia Physical Anesthesia Plan  ASA: 2  Anesthesia Plan: Spinal   Post-op Pain Management:    Induction:   PONV Risk Score and Plan: 2 and Ondansetron and Treatment may vary due to age or medical condition  Airway Management Planned: Natural Airway  Additional Equipment: None  Intra-op Plan:   Post-operative Plan:   Informed Consent: I have reviewed the patients History and Physical, chart, labs and discussed the procedure including the risks, benefits and alternatives for the proposed anesthesia with the patient or authorized representative who has indicated  his/her understanding and acceptance.  Dental advisory given  Plan Discussed with: CRNA  Anesthesia Plan Comments:        Anesthesia Quick Evaluation

## 2023-06-29 ENCOUNTER — Encounter (HOSPITAL_COMMUNITY): Payer: Self-pay | Admitting: Obstetrics and Gynecology

## 2023-06-29 LAB — CBC
HCT: 32.4 % — ABNORMAL LOW (ref 36.0–46.0)
Hemoglobin: 10.7 g/dL — ABNORMAL LOW (ref 12.0–15.0)
MCH: 29.8 pg (ref 26.0–34.0)
MCHC: 33 g/dL (ref 30.0–36.0)
MCV: 90.3 fL (ref 80.0–100.0)
Platelets: 199 10*3/uL (ref 150–400)
RBC: 3.59 MIL/uL — ABNORMAL LOW (ref 3.87–5.11)
RDW: 13.2 % (ref 11.5–15.5)
WBC: 14.5 10*3/uL — ABNORMAL HIGH (ref 4.0–10.5)
nRBC: 0 % (ref 0.0–0.2)

## 2023-06-29 NOTE — Progress Notes (Signed)
Subjective: Postpartum Day 1: Cesarean Delivery Patient reports tolerating PO.   Foley removed yesterday and she was unable to void. Straight cath x 1 and still unable. Now foley in place. Ambulating well, minimal pain Objective: Vital signs in last 24 hours: Temp:  [97 F (36.1 C)-98.3 F (36.8 C)] 98.3 F (36.8 C) (07/16 0759) Pulse Rate:  [68-108] 79 (07/16 0759) Resp:  [10-18] 18 (07/16 0759) BP: (97-116)/(62-76) 100/66 (07/16 0759) SpO2:  [96 %-100 %] 100 % (07/16 0759)  Physical Exam:  General: alert, cooperative, and no distress Lochia: appropriate Uterine Fundus: firm Incision: healing well DVT Evaluation: No evidence of DVT seen on physical exam.  Recent Labs    06/28/23 0840 06/29/23 0440  HGB 12.6 10.7*  HCT 37.4 32.4*    Assessment/Plan: Status post Cesarean section. Postoperative course complicated by post operative urinary retention. Will remove foley tomorrow.   Continue current care.  Roselle Locus II, MD 06/29/2023, 8:15 AM

## 2023-06-29 NOTE — Lactation Note (Signed)
This note was copied from a baby's chart.  NICU Lactation Consultation Note  Patient Name: Tara Watson [id number] Date: 06/29/2023 Age:31 hours  Reason for consult: Follow-up assessment; NICU baby; Late-preterm 34-36.6wks; 1st time breastfeeding; Infant < 6lbs  SUBJECTIVE Visited with family of 8 hours old LPI NICU female; Tara Watson is a P2 but this is her first time breastfeeding. She reports she's been pumping and trying to follow the 3-hours schedule but "nothing" comes out yet. Explained that the importance of pumping this early on is mainly for breast stimulation and not to get volume, she voiced understanding. Provided a hands-free pumping top in size XL "Green" for hands on pumping. Revised pumping schedule, lactogenesis II and anticipatory guidelines.  OBJECTIVE Infant data: Mother's Current Feeding Choice: Breast Milk and Donor Milk  Infant feeding assessment Scale for Readiness: 2 (Pacifier attempted, infant sucked for a few bursts and dropped it)   Maternal data: [id number] C-Section, Low Transverse Has patient been taught Hand Expression?: Yes Hand Expression Comments: no colostrum noted yet Significant Breast History:: moderate breast changes during the pregnancy Current breast feeding challenges:: NICU admission Does the patient have breastfeeding experience prior to this delivery?: No Pumping frequency: 4 times/24 hours Pumped volume: 0 mL (droplets) Flange Size: 21 (needs smaller but currently out of stock) Risk factor for low milk supply:: prematurity, infant separation, first time breasteeding (she didn't get engorged with her first baby when choosing not to breastfeed)  Pump: Personal (Spectra S2)  ASSESSMENT Infant: Feeding Status: Scheduled 8-11-2-5  Maternal: Milk volume: Normal  INTERVENTIONS/PLAN Interventions: Interventions: Breast feeding basics reviewed; Coconut oil; Education; DEBP Tools: Pump; Flanges; Coconut oil; Hands-free pumping top (Size  XL "Green") Pump Education: Setup, frequency, and cleaning; Milk Storage  Plan: Encouraged to continue pumping every 3 hours, ideally 8 pumping sessions/24 hours Breast massage, hand expression and coconut oil were also encouraged prior pumping   FOB and 8 y.o big brother present and supportive. All questions and concerns answered, family to contact [facility] services PRN.  Consult Status: NICU follow-up NICU Follow-up type: Maternal D/C visit   Tara Watson Tara Watson 06/29/2023, 12:44 PM

## 2023-06-30 LAB — SURGICAL PATHOLOGY

## 2023-06-30 NOTE — Lactation Note (Signed)
This note was copied from a baby's chart.  NICU Lactation Consultation Note  Patient Name: Tara Watson [id number] Date: 06/30/2023 Age:31 hours  Reason for consult: Follow-up assessment; Breastfeeding assistance; 1st time breastfeeding; RN request; Late-preterm 34-36.6wks; Infant < 6lbs; NICU baby  SUBJECTIVE  LC in to visit with P2 Mom of baby "Tara Watson" as she was going to the breast for the first time.  RN states that baby was showing cues and sucking on pacifier.  Baby place STS in football hold on right breast.  Assisted Mom to hold baby's head from ear to ear to support her neck, upper back and lower head.  Mom guided to support her breast with 4 fingers under and a thumb above.  With permission, hand expressed a drop of colostrum from nipple.  Baby not opening her mouth.  LC tried a little suck training and baby transitioned to the breast.  Very brief non-nutritive sucks noted.  Mom felt the tugs.  Baby showing no signs of stress and remained relaxed.  LC able to tug on baby's chin to open her mouth a little wider.  Another couple sucks noted, non-nutritive at this point.  Reassured Mom that this was normal LPT feeding behavior.  Encouraged continued STS and offering the breast to "lick and learn" when baby is showing feeding interest.  Encouraged continued consistent pumping to support a full milk supply.  Mom aware of bringing her pump parts and bins upstairs to baby's room upon her discharge.  OBJECTIVE Infant data: Mother's Current Feeding Choice: Breast Milk and Donor Milk  Infant feeding assessment Scale for Readiness: 3   Maternal data: [id number] C-Section, Low Transverse Pumping frequency: 4-6 times per 24 hrs Pumped volume: 0 mL (drops) Flange Size: 21 (needs smaller but currently out of stock)  Pump: Personal (Spectra S2)  ASSESSMENT Infant: LATCH Documentation Latch: 1 Audible Swallowing: 0 Type of Nipple: 2 Comfort (Breast/Nipple): 2 Hold (Positioning):  1 LATCH Score: 6  Feeding Status: Scheduled 8-11-2-5  Maternal: Milk volume: Normal  INTERVENTIONS/PLAN Interventions: Interventions: Breast feeding basics reviewed; Assisted with latch; Skin to skin; Breast massage; Hand express; Position options; Support pillows; Adjust position; DEBP; Education Tools: Pump; Flanges; Coconut oil; Hands-free pumping top (Size XL "Green") Pump Education: Setup, frequency, and cleaning  Plan: Consult Status: NICU follow-up NICU Follow-up type: Verify onset of copious milk; Verify absence of engorgement   Tara Watson 06/30/2023, 12:28 PM

## 2023-06-30 NOTE — Progress Notes (Signed)
Patient screened out for psychosocial assessment since none of the following apply: Psychosocial stressors documented in mother or baby's chart Gestation less than 32 weeks Code at delivery  Infant with anomalies  MOB was referred for history of depression/anxiety. * Referral screened out by Clinical Social Worker because none of the following criteria appear to apply: ~ History of anxiety/depression during this pregnancy, or of post-partum depression following prior delivery. ~ Diagnosis of anxiety and/or depression within last 3 years OR * MOB's symptoms currently being treated with medication and/or therapy.  Please contact the Clinical Social Worker if needs arise, by [facility] request, or if MOB scores greater than 9/yes to question 10 on [place] Postpartum Depression Screen.   Laurey Arrow, MSW, LCSW Clinical Social Work [phone]

## 2023-06-30 NOTE — Progress Notes (Signed)
Chaplain attempted to introduce spiritual care to [facility] as she has a baby in the NICU. Pt was not available at the time of visit. Will continue to follow up..  Please page as further needs arise.  Maryanna Shape. Carley Hammed, M.Div. [facility] Chaplain Pager [phone] Office [phone]

## 2023-06-30 NOTE — Progress Notes (Signed)
Subjective: Postpartum Day 2: Cesarean Delivery Patient reports tolerating PO.   Foley removed and she was unable to void. Straight cath x 1 and still unable.Foley was placed again overnight. Removed again this am only 2 hours ago.  Ambulating well, minimal pain Objective: Vital signs in last 24 hours: Temp:  [97.6 F (36.4 C)-98.7 F (37.1 C)] 97.8 F (36.6 C) (07/17 0842) Pulse Rate:  [66-105] 105 (07/17 0842) Resp:  [16-20] 19 (07/17 0842) BP: (92-126)/(60-85) 126/85 (07/17 0842) SpO2:  [98 %-100 %] 99 % (07/17 0842)  Physical Exam:  General: alert, cooperative, and no distress Lochia: appropriate Uterine Fundus: firm Incision: healing well DVT Evaluation: No evidence of DVT seen on physical exam.  Recent Labs    06/28/23 0840 06/29/23 0440  HGB 12.6 10.7*  HCT 37.4 32.4*    Assessment/Plan: Status post Cesarean section.  Post op course c/b uirinary retation. Foley removed this am. Await TOV. Baby girl doing well in NICU. Continue current care.  Ranae Pila, MD 06/30/2023, 9:30 AM

## 2023-07-01 MED ORDER — DIBUCAINE (PERIANAL) 1 % EX OINT: 1.0000 | TOPICAL_OINTMENT | CUTANEOUS | Status: DC | PRN

## 2023-07-01 MED ORDER — WITCH HAZEL-GLYCERIN EX PADS: 1.0000 | MEDICATED_PAD | CUTANEOUS | Status: DC | PRN

## 2023-07-01 MED ORDER — ACETAMINOPHEN 325 MG PO TABS: 650.0000 mg | ORAL_TABLET | ORAL | 0 refills | Status: AC | PRN

## 2023-07-01 MED ORDER — OXYCODONE HCL 5 MG PO TABS: 5.0000 mg | ORAL_TABLET | ORAL | 0 refills | Status: AC | PRN

## 2023-07-01 MED ORDER — IBUPROFEN 600 MG PO TABS: 600.0000 mg | ORAL_TABLET | Freq: Four times a day (QID) | ORAL | 0 refills | Status: AC

## 2023-07-01 NOTE — Progress Notes (Signed)
Postpartum Progress Note  Postpartum Day 3 s/p primary Cesarean section.  34 weeks, preterm labor in breech presentation.   Subjective:  Patient reports no overnight events.  She reports well controlled pain, ambulating without difficulty, voiding spontaneously, tolerating PO.  She reports Positive flatus, Positive BM.  Vaginal bleeding is minimal.  Objective: Blood pressure 111/72, pulse 88, temperature 98 F (36.7 C), temperature source Oral, resp. rate 17, height 4' 11.02" (1.499 m), weight 88.5 kg, SpO2 99%, unknown if currently breastfeeding.  Physical Exam:  General: alert and no distress Lochia: appropriate Abdomen: soft, ATTP Uterine Fundus: firm Incision: dressing in place DVT Evaluation: No evidence of DVT seen on physical exam.  Recent Labs    06/28/23 0840 06/29/23 0440  HGB 12.6 10.7*  HCT 37.4 32.4*    Assessment/Plan: Postpartum Day , s/p C-section Postoperative urinary retention requiring replacement of foley.  Yesterday she had multiple spontaneous voids following removal. Pain well controlled, has been visiting baby in NICU Lactation following Doing well, continue routine postpartum care. Anticipate discharge today   LOS: 3 days   Tara Watson 07/01/2023, 7:20 AM

## 2023-07-01 NOTE — Plan of Care (Signed)
  Problem: Education: Goal: Knowledge of the prescribed therapeutic regimen will improve Outcome: Adequate for Discharge Goal: Understanding of sexual limitations or changes related to disease process or condition will improve Outcome: Adequate for Discharge Goal: Individualized Educational Video(s) Outcome: Adequate for Discharge   Problem: Self-Concept: Goal: Communication of feelings regarding changes in body function or appearance will improve Outcome: Adequate for Discharge   Problem: Skin Integrity: Goal: Demonstration of wound healing without infection will improve Outcome: Adequate for Discharge   Problem: Education: Goal: Knowledge of condition will improve Outcome: Adequate for Discharge Goal: Individualized Educational Video(s) Outcome: Adequate for Discharge Goal: Individualized Newborn Educational Video(s) Outcome: Adequate for Discharge   Problem: Activity: Goal: Will verbalize the importance of balancing activity with adequate rest periods Outcome: Adequate for Discharge Goal: Ability to tolerate increased activity will improve Outcome: Adequate for Discharge   Problem: Coping: Goal: Ability to identify and utilize available resources and services will improve Outcome: Adequate for Discharge   Problem: Life Cycle: Goal: Chance of risk for complications during the postpartum period will decrease Outcome: Adequate for Discharge   Problem: Role Relationship: Goal: Ability to demonstrate positive interaction with newborn will improve Outcome: Adequate for Discharge   Problem: Skin Integrity: Goal: Demonstration of wound healing without infection will improve Outcome: Adequate for Discharge

## 2023-07-01 NOTE — Progress Notes (Signed)
   07/01/23 1250  Departure Condition  Departure Condition Good  Mobility at [facility]  Patient/Caregiver Teaching Teach Back Method Used;Discharge instructions reviewed;Prescriptions reviewed;Follow-up care reviewed;Pain management discussed;Medications discussed;Patient/caregiver verbalized understanding;Educated about hypertension in pregnancy  Departure Mode With family  Was procedural sedation performed on this patient during this visit? No   Patient alert and oriented x4, pain stable.

## 2023-07-02 NOTE — Discharge Summary (Signed)
Obstetric Discharge Summary  Tara Watson is a 32 y.o. female that presented on 06/27/2023 at [redacted]w[redacted]d for contractions and cervical dilation to 3 cm.  She was admitted for concern for preterm labor. She continued to report contractions, eventually dilating to 5.5 cm, and on 06/28/2023, she underwent primary C section for breech presentation and preterm labor. Her postpartum course was notable for urinary retention requiring replacement of urinary catheter.  This was removed again on PPD#2 and she was able to void without difficulty. On PPD#3, she reported well controlled pain, spontaneous voiding, ambulating without difficulty, and tolerating PO.  She was stable for discharge home on 07/01/2023 with plans for in-office follow up.  Hemoglobin  Date Value Ref Range Status  06/29/2023 10.7 (L) 12.0 - 15.0 g/dL Final   HCT  Date Value Ref Range Status  06/29/2023 32.4 (L) 36.0 - 46.0 % Final    Physical Exam:  General: alert and no distress Lochia: appropriate Uterine Fundus: firm Incision: healing well DVT Evaluation: No evidence of DVT seen on physical exam.  Discharge Diagnoses: Preterm delivery, Cesarean section  Discharge Information: Date: 07/02/2023 Activity: Pelvic rest, as tolerated Diet: routine Medications: Tylenol, motrin, oxycodone Condition: stable Instructions: Refer to practice specific booklet.  Discussed prior to discharge.  Discharge to: Home  Follow-up Information     [place] Follow up.   Why: Please follow up for a 6 week postpartum visit. Contact information: [address] [place] [address] [phone]                 Newborn Data: Live born female  Birth Weight: 5 lb 4.3 oz (2390 g) APGAR: 8, 9  Newborn Delivery   Birth date/time: 06/28/2023 10:03:53 Delivery type: C-Section, Low Transverse Trial of labor: No C-section categorization: Primary     Home with mother.  Lyn Henri 07/02/2023,  6:43 PM

## 2023-07-03 ENCOUNTER — Ambulatory Visit (HOSPITAL_COMMUNITY): Payer: Self-pay

## 2023-07-03 NOTE — Lactation Note (Signed)
This note was copied from a baby's chart.  NICU Lactation Consultation Note  Patient Name: Tara Watson Date: 07/03/2023 Age:31 days  Reason for consult: Follow-up assessment; NICU baby; Late-preterm 34-36.6wks; Infant < 6lbs  SUBJECTIVE  LC in to visit with P2 Mom of baby "Tara Watson".  Mom about to give baby her first bottle.  Mom shared that pumping is not for her and she has given up breastfeeding.  Mom is choosing to formula feed baby exclusively.    Mom denies engorgement.  Mom last pumped yesterday, still not expressing any milk.    Engorgement treatment for non-nursing Moms reviewed. Lactation services completed.  OBJECTIVE Infant data: No data recorded Infant feeding assessment Scale for Readiness: 3 Scale for Quality: 3   Maternal data: [id number] C-Section, Low Transverse No data recorded Pump: Personal (Spectra S2)  ASSESSMENT Infant: Feeding Status: Scheduled 9-12-3-6  Maternal: No data recorded INTERVENTIONS/PLAN Interventions: Discharge Education: Engorgement and breast care  Plan: Consult Status: Complete   Judee Clara 07/03/2023, 11:55 AM

## 2023-07-26 ENCOUNTER — Telehealth (HOSPITAL_COMMUNITY): Payer: Self-pay | Admitting: *Deleted

## 2023-07-26 NOTE — Telephone Encounter (Signed)
07/26/2023  Name: DAYANAH DJURIC MRN: [id number] DOB: 01/20/1991  Reason for Call:  Transition of Care Hospital Discharge Call  Contact Status: Patient Contact Status: Message  Language assistant needed: Interpreter Mode: Interpreter Not Needed        Follow-Up Questions:    Inocente Salles Postnatal Depression Scale:  In the Past 7 Days:    PHQ2-9 Depression Scale:     Discharge Follow-up:    Post-discharge interventions: NA  Salena Saner, RN 07/26/2023 15:14
